# Patient Record
Sex: Female | Born: 1935 | Race: White | Hispanic: No | Smoking: Never smoker
Health system: Southern US, Community
[De-identification: ages and names within clinical notes are randomized; demographics above are authoritative.]

## PROBLEM LIST (undated history)

## (undated) DIAGNOSIS — M1990 Unspecified osteoarthritis, unspecified site: Secondary | ICD-10-CM

## (undated) DIAGNOSIS — Z87442 Personal history of urinary calculi: Secondary | ICD-10-CM

## (undated) DIAGNOSIS — E039 Hypothyroidism, unspecified: Secondary | ICD-10-CM

## (undated) DIAGNOSIS — H409 Unspecified glaucoma: Secondary | ICD-10-CM

## (undated) DIAGNOSIS — K219 Gastro-esophageal reflux disease without esophagitis: Secondary | ICD-10-CM

## (undated) DIAGNOSIS — I2510 Atherosclerotic heart disease of native coronary artery without angina pectoris: Secondary | ICD-10-CM

## (undated) DIAGNOSIS — I10 Essential (primary) hypertension: Secondary | ICD-10-CM

## (undated) DIAGNOSIS — F419 Anxiety disorder, unspecified: Secondary | ICD-10-CM

## (undated) DIAGNOSIS — I214 Non-ST elevation (NSTEMI) myocardial infarction: Secondary | ICD-10-CM

## (undated) DIAGNOSIS — E079 Disorder of thyroid, unspecified: Secondary | ICD-10-CM

## (undated) HISTORY — PX: KIDNEY SURGERY: SHX687

## (undated) HISTORY — PX: ABDOMINAL HYSTERECTOMY: SHX81

## (undated) HISTORY — PX: TONSILLECTOMY: SUR1361

## (undated) HISTORY — DX: Atherosclerotic heart disease of native coronary artery without angina pectoris: I25.10

---

## 2004-11-30 ENCOUNTER — Ambulatory Visit: Payer: Self-pay | Admitting: Internal Medicine

## 2005-11-26 ENCOUNTER — Ambulatory Visit: Payer: Self-pay | Admitting: Pain Medicine

## 2005-12-03 ENCOUNTER — Ambulatory Visit: Payer: Self-pay | Admitting: Pain Medicine

## 2005-12-04 ENCOUNTER — Ambulatory Visit: Payer: Self-pay | Admitting: Pain Medicine

## 2005-12-24 ENCOUNTER — Ambulatory Visit: Payer: Self-pay | Admitting: Internal Medicine

## 2006-01-08 ENCOUNTER — Ambulatory Visit: Payer: Self-pay | Admitting: Pain Medicine

## 2006-03-12 ENCOUNTER — Ambulatory Visit: Payer: Self-pay | Admitting: Internal Medicine

## 2006-10-23 ENCOUNTER — Ambulatory Visit: Payer: Self-pay | Admitting: Internal Medicine

## 2006-12-30 ENCOUNTER — Ambulatory Visit: Payer: Self-pay | Admitting: Internal Medicine

## 2007-10-27 IMAGING — US US PELV - US TRANSVAGINAL
1 series · 18 of 19 positions shown · non-contrast
Comparison: none

REASON FOR EXAM: Abdomen and pelvic pain
COMMENTS:

PROCEDURE:     US  - US PELVIS MASS EXAM  - [DATE] [DATE] [DATE] [DATE]
RESULT:       The patient has had a prior hysterectomy. The ovaries are not
visualized.  No pelvic fluid collections are noted.

[Series 1: us pelv - us transvaginal · 18 of 19 slices shown]
[im 1/19]
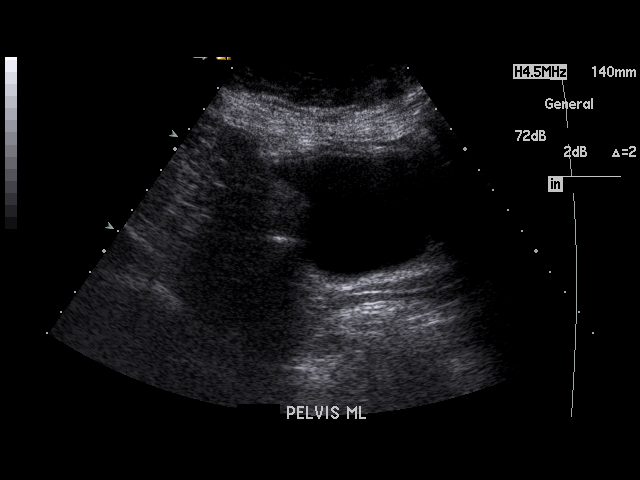
[im 2/19]
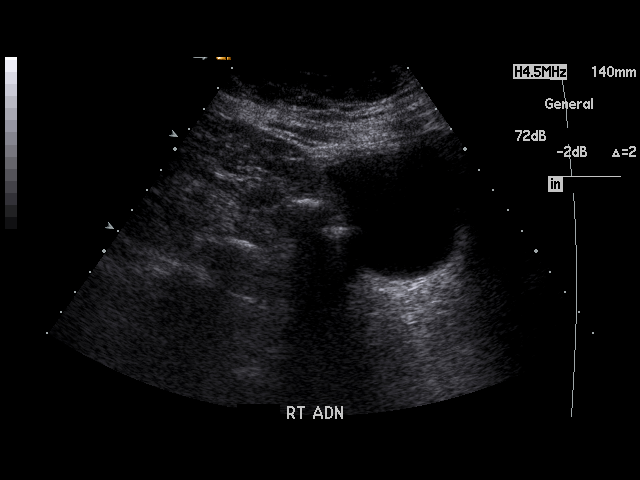
[im 3/19]
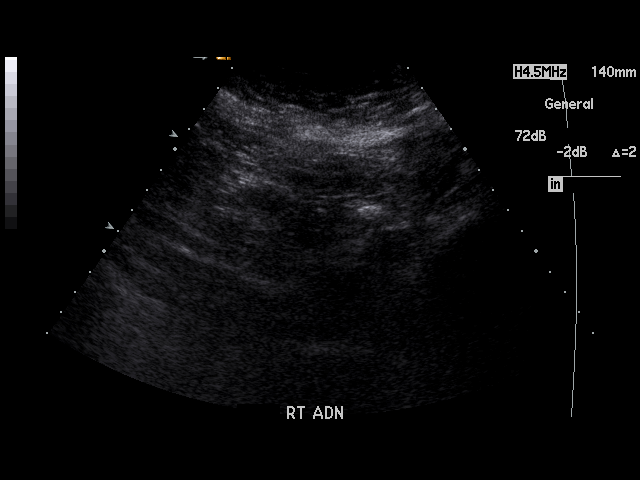
[im 4/19]
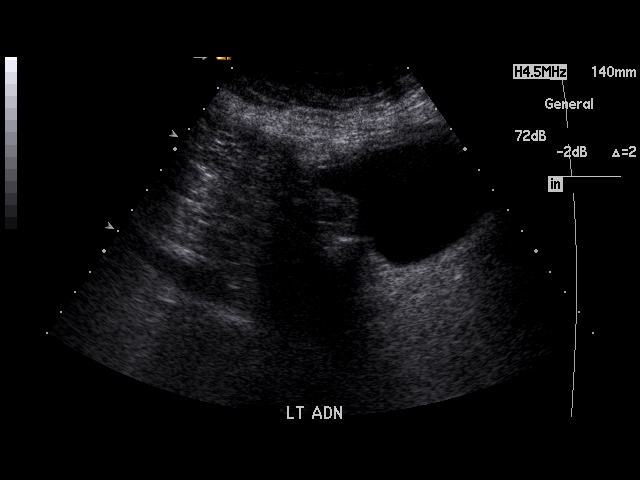
[im 5/19]
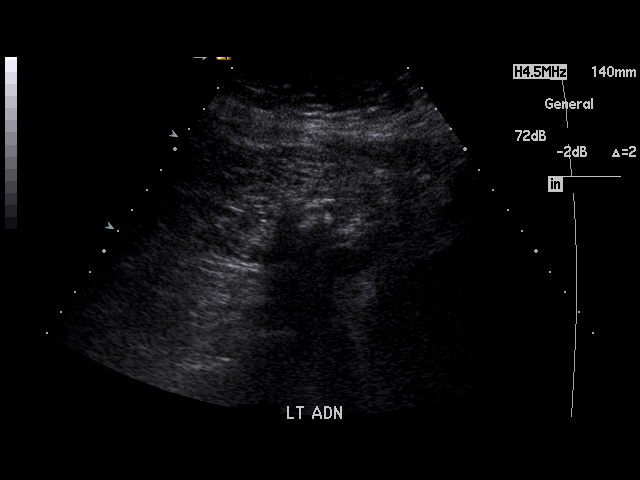
[im 6/19]
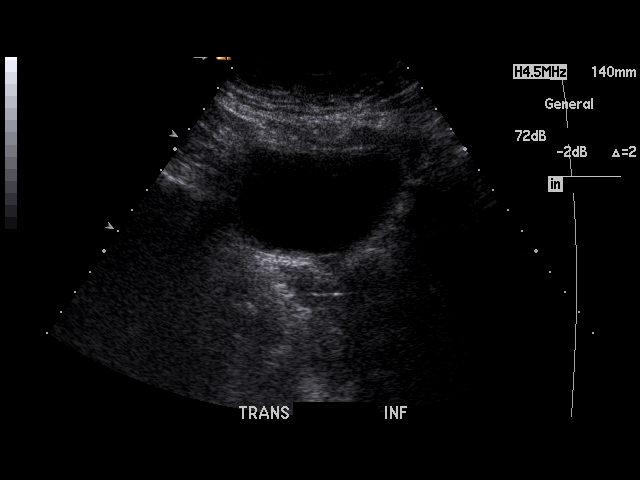
[im 7/19]
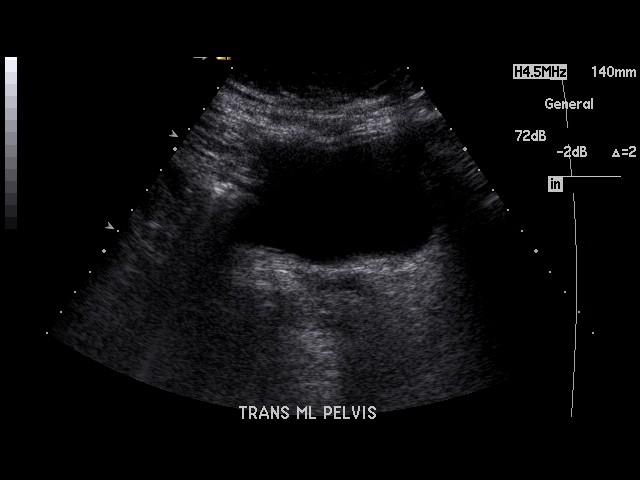
[im 8/19]
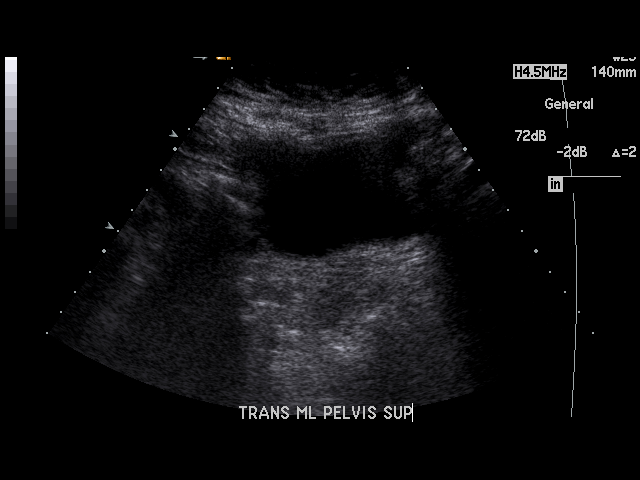
[im 9/19]
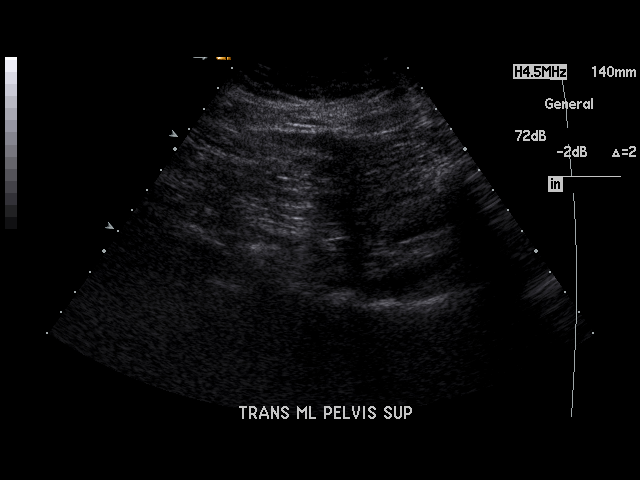
[im 11/19]
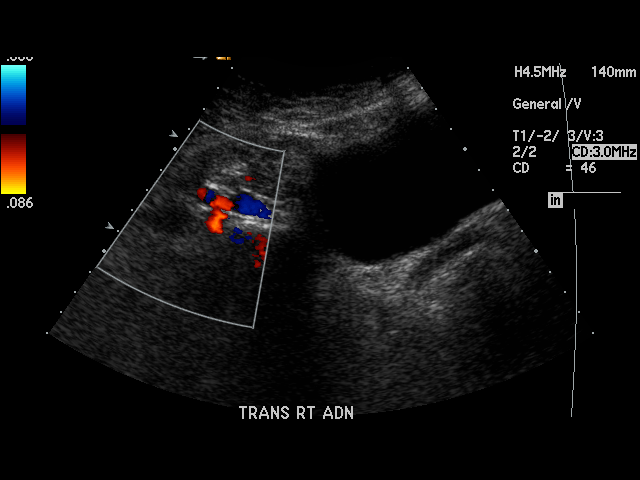
[im 12/19]
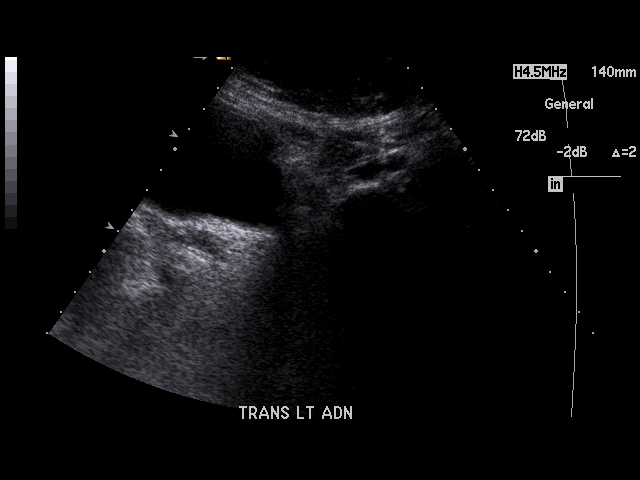
[im 13/19]
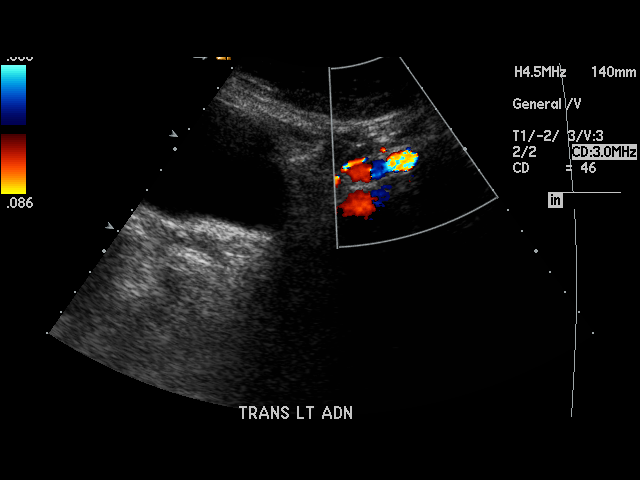
[im 14/19]
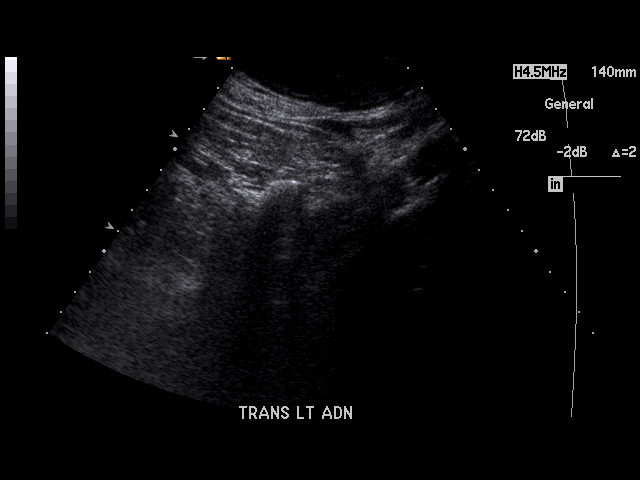
[im 15/19]
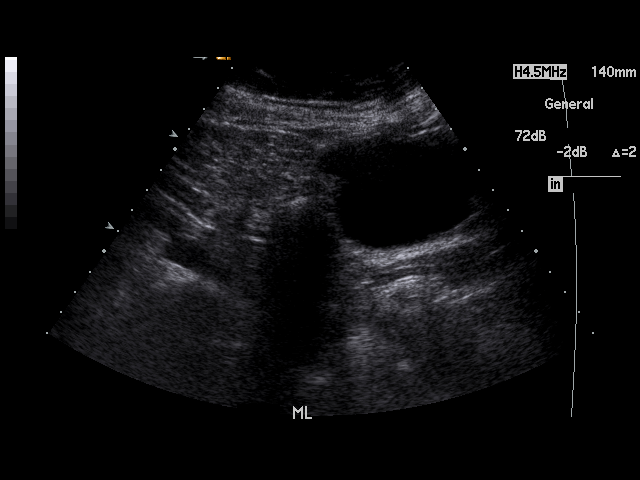
[im 16/19]
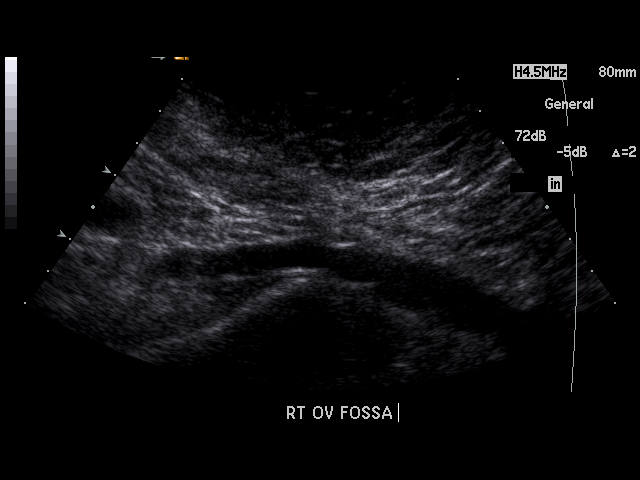
[im 17/19]
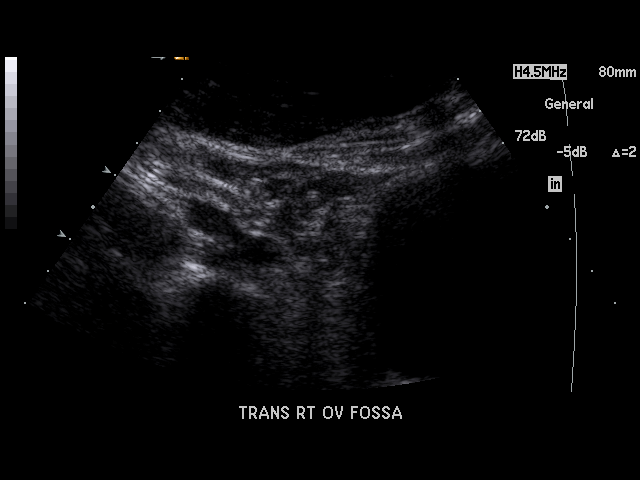
[im 18/19]
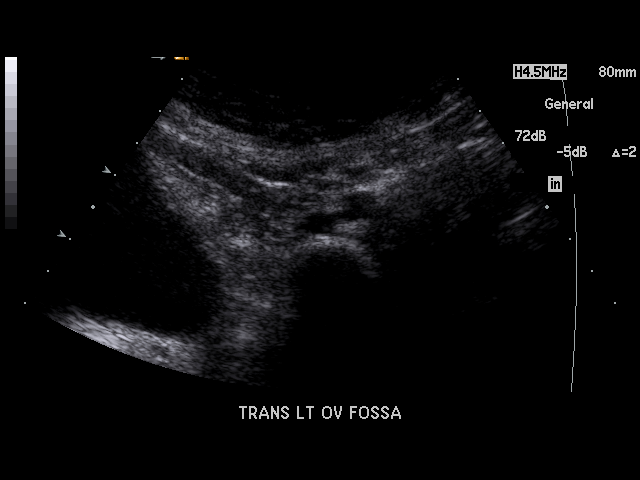
[im 19/19]
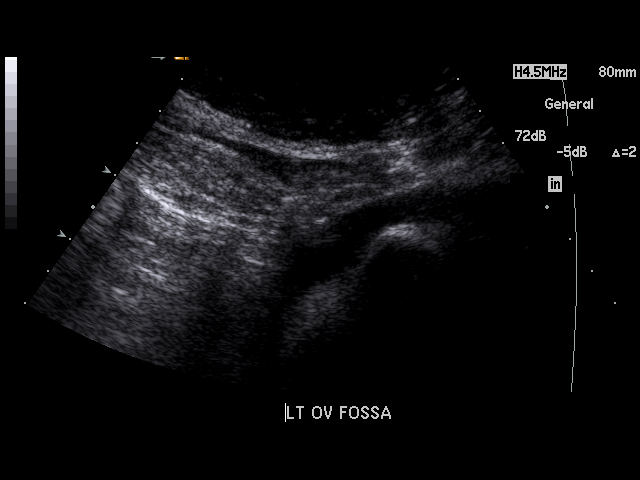

[18 of 19 positions shown; findings below may reference images not displayed]

IMPRESSION: Nonspecific pelvic ultrasound.

## 2007-12-28 ENCOUNTER — Ambulatory Visit: Payer: Self-pay | Admitting: Internal Medicine

## 2008-03-18 ENCOUNTER — Emergency Department: Payer: Self-pay | Admitting: Emergency Medicine

## 2008-03-28 ENCOUNTER — Ambulatory Visit: Payer: Self-pay | Admitting: Internal Medicine

## 2008-04-11 ENCOUNTER — Ambulatory Visit: Payer: Self-pay | Admitting: Internal Medicine

## 2008-12-29 ENCOUNTER — Ambulatory Visit: Payer: Self-pay | Admitting: Internal Medicine

## 2009-11-12 IMAGING — CT CT CHEST W/ CM
1 series · 15 of 32 positions shown, 19 images · IV contrast (APPLIED)
Comparison: none

REASON FOR EXAM: CP   SOB
COMMENTS:

[Series 4: soft tissue · axial · 0.66mm/px · z∈[-116,+150]mm · 15 of 100 slices shown, 19 images]
[im 7/100  soft-tissue]
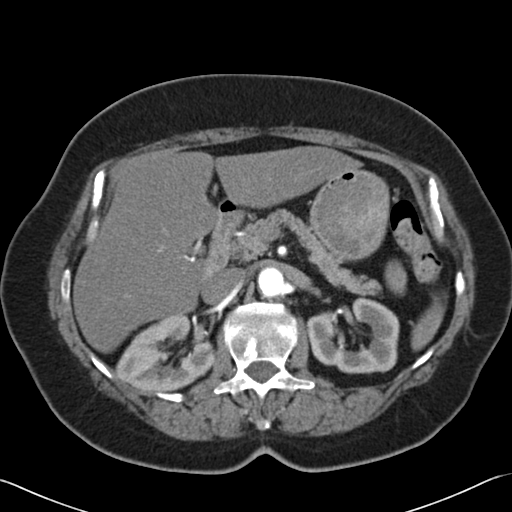
[im 7/100  bone]
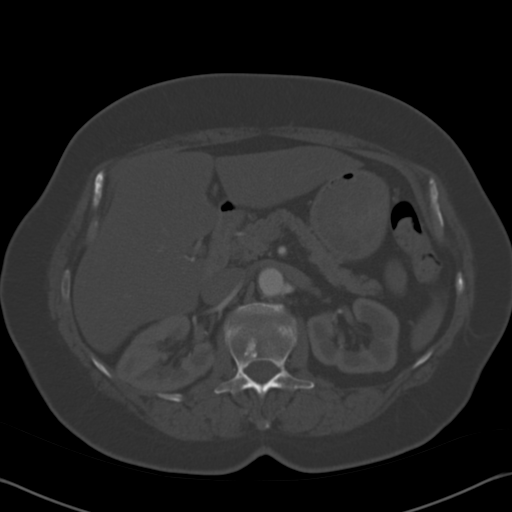
[im 13/100  soft-tissue]
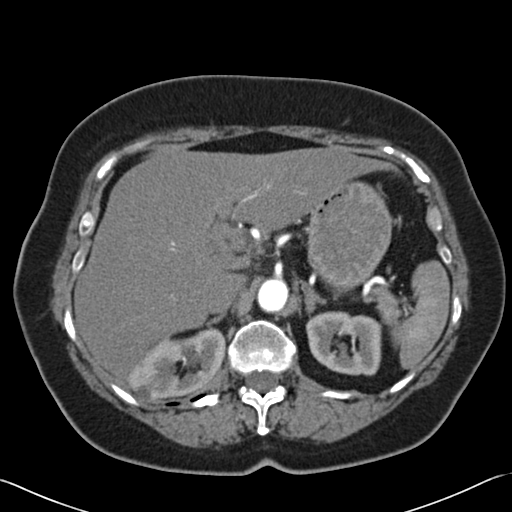
[im 20/100  soft-tissue]
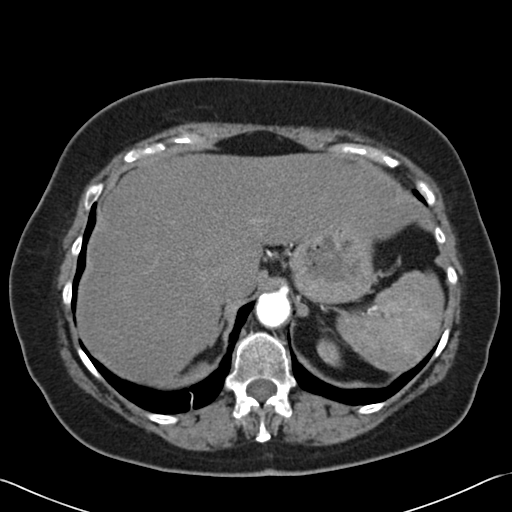
[im 29/100  soft-tissue]
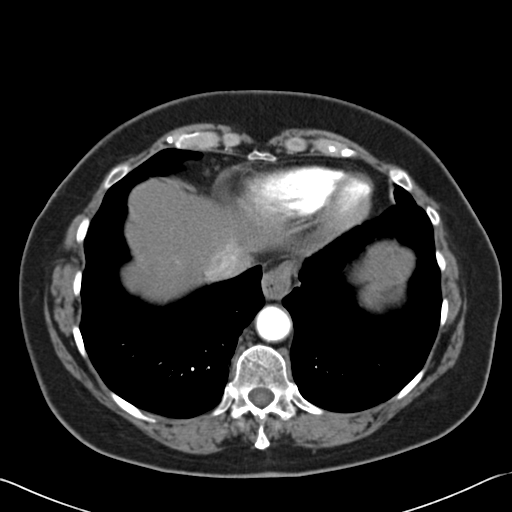
[im 36/100  soft-tissue]
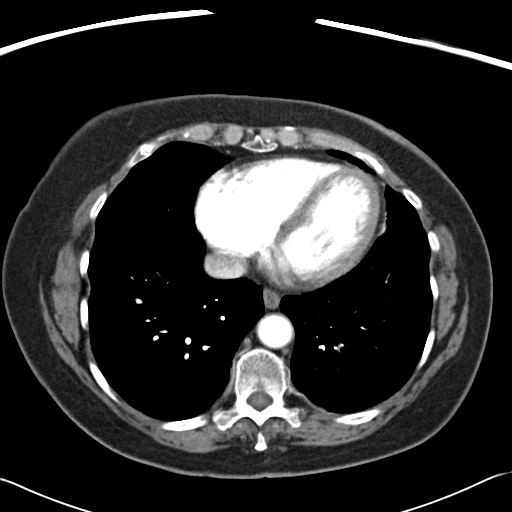
[im 42/100  soft-tissue]
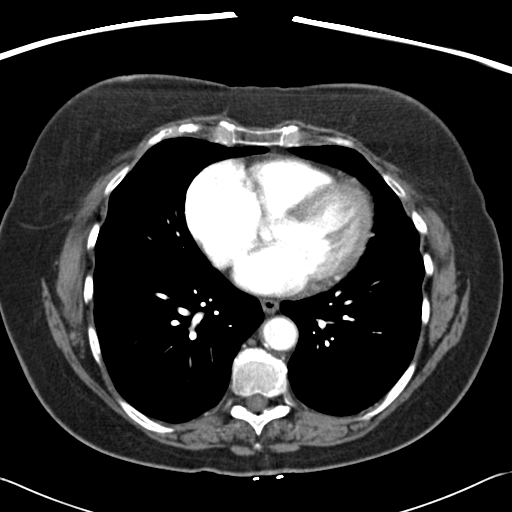
[im 52/100  soft-tissue]
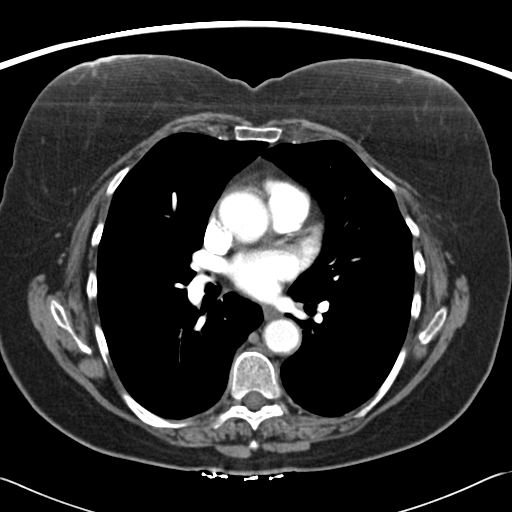
[im 58/100  soft-tissue]
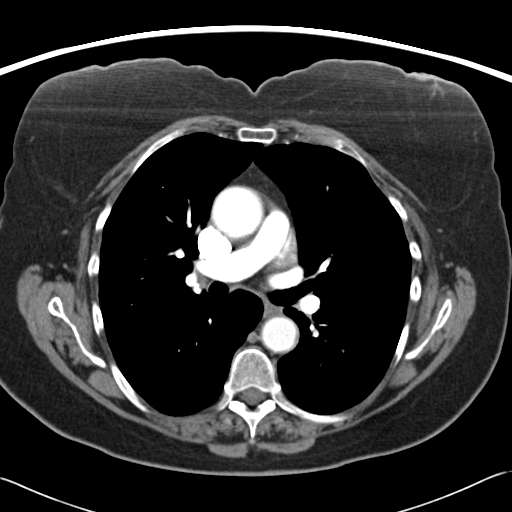
[im 64/100  soft-tissue]
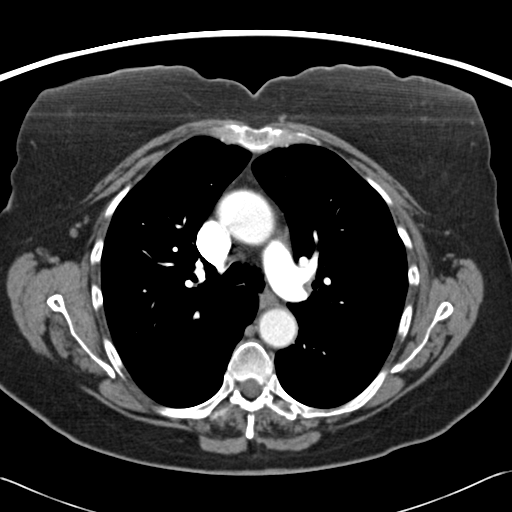
[im 64/100  bone]
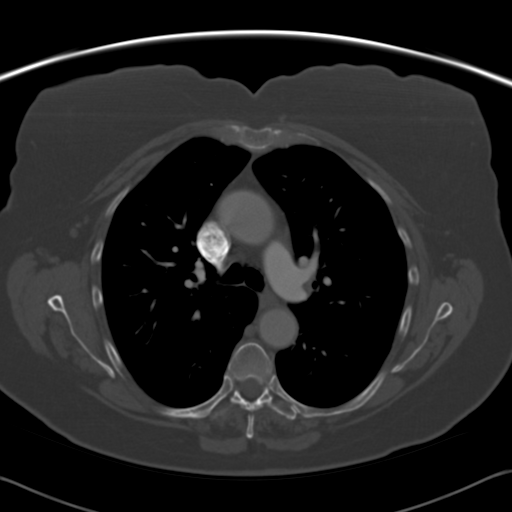
[im 71/100  soft-tissue]
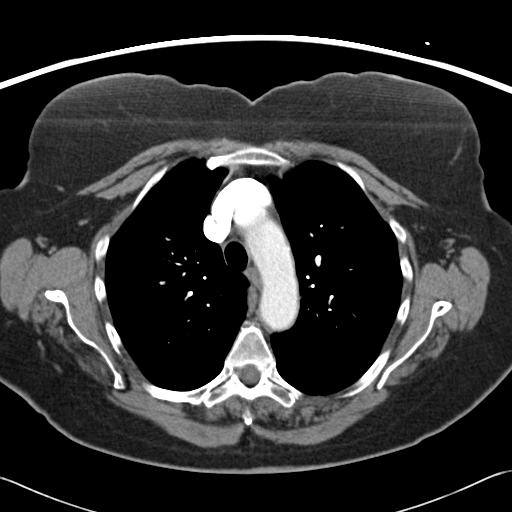
[im 80/100  soft-tissue]
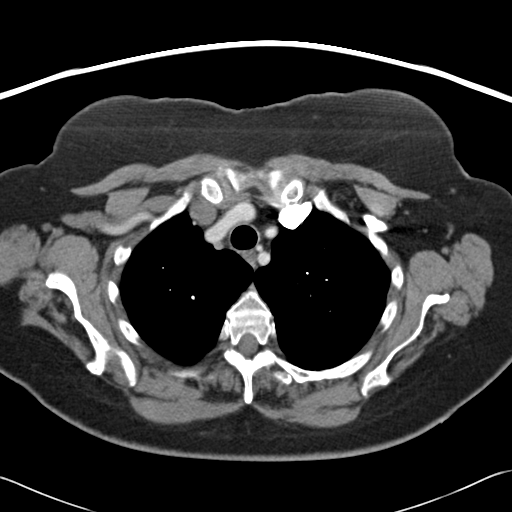
[im 87/100  soft-tissue]
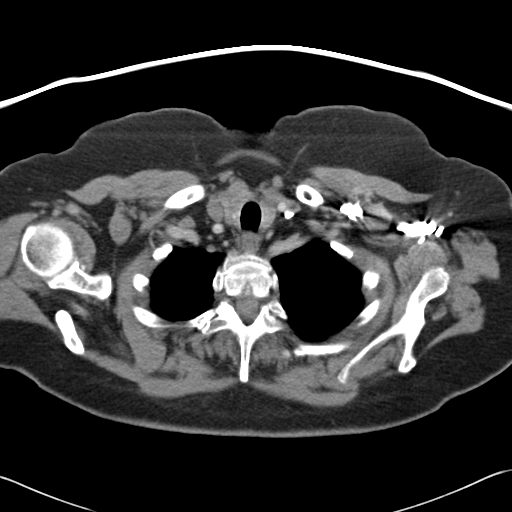
[im 87/100  lung]
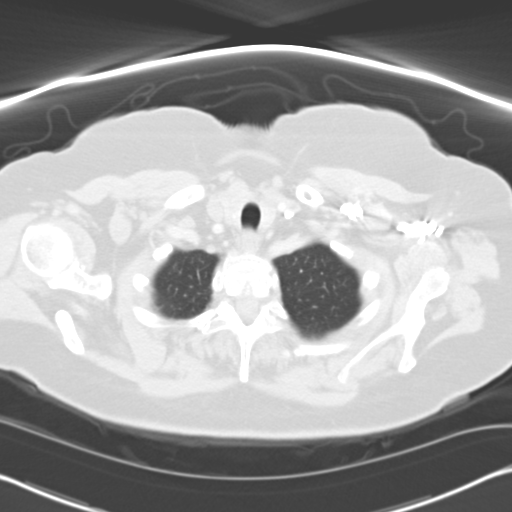
[im 90/100  lung]
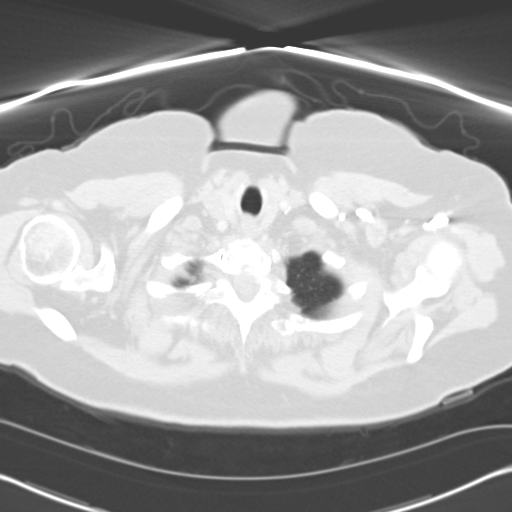
[im 93/100  soft-tissue]
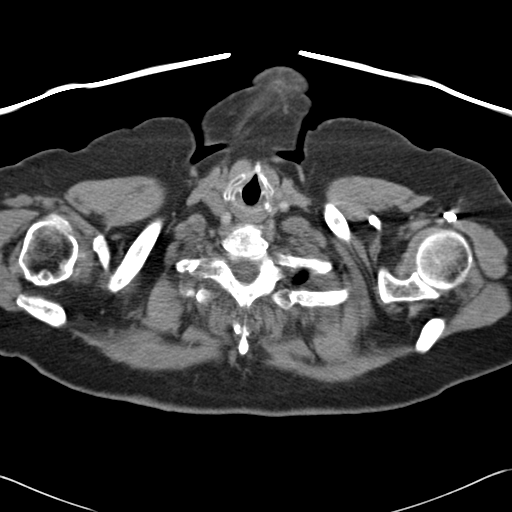
[im 93/100  lung]
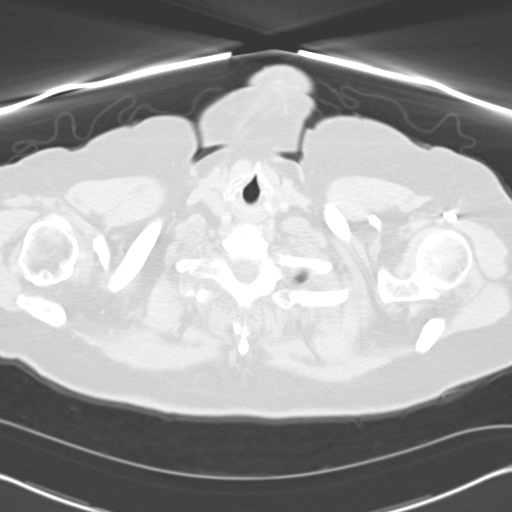
[im 96/100  lung]
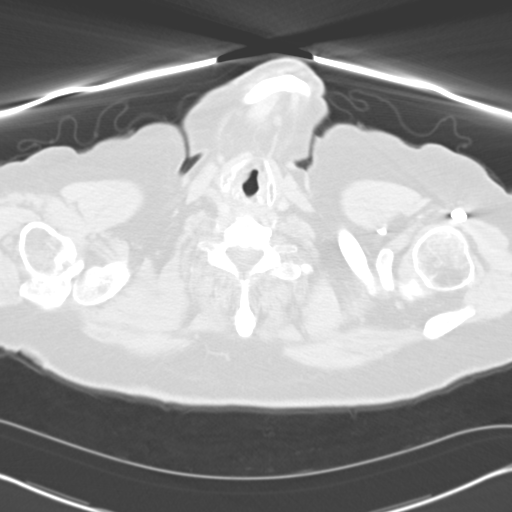

[15 of 32 positions shown; findings below may reference images not displayed]

PROCEDURE:     CT  - CT CHEST WITH CONTRAST  - March 28, 2008  [DATE]

RESULT:     Helical, 3 mm sections were obtained from the thoracic inlet
through the lung bases status post intravenous administration of 75 ml of
Osovue-THN.

Evaluation of the mediastinum and hilar regions and structures demonstrates
no evidence of mediastinal or hilar adenopathy or masses. There is no
evidence of filling defects within the main lobar or segmental pulmonary
arteries. Evaluation of the lung parenchyma demonstrates a linear area of
increased density along the medial basal segment of the right lower lobe.
This has a linear appearance and has a small soft tissue component.
Differential considerations are scar versus atelectasis versus pleural tag.
This can be monitored, if and as clinically warranted, though more ominous
etiologies are of much lower differential consideration. The visualized
upper abdominal viscera demonstrate a low attenuating nodule along the
posterior upper pole region of the right kidney likely representing a cyst.
The remainder of the visualized upper abdominal viscera is otherwise
unremarkable.
IMPRESSION: 1.     Likely scar versus atelectasis within the right lung base.
2.     No further focal or acute intrathoracic abnormalities.
3.     Likely small cyst within the posterior dome of the right kidney.

## 2010-01-11 ENCOUNTER — Ambulatory Visit: Payer: Self-pay | Admitting: Internal Medicine

## 2010-08-29 ENCOUNTER — Ambulatory Visit: Payer: Self-pay | Admitting: Unknown Physician Specialty

## 2011-02-25 ENCOUNTER — Ambulatory Visit: Payer: Self-pay | Admitting: Internal Medicine

## 2011-03-27 ENCOUNTER — Ambulatory Visit: Payer: Self-pay | Admitting: Internal Medicine

## 2012-02-26 ENCOUNTER — Ambulatory Visit: Payer: Self-pay | Admitting: Internal Medicine

## 2013-02-26 ENCOUNTER — Ambulatory Visit: Payer: Self-pay | Admitting: Internal Medicine

## 2013-09-10 ENCOUNTER — Observation Stay: Payer: Self-pay | Admitting: Internal Medicine

## 2013-09-10 LAB — CBC
HCT: 38.2 % (ref 35.0–47.0)
HGB: 12.4 g/dL (ref 12.0–16.0)
MCH: 30.3 pg (ref 26.0–34.0)
MCHC: 32.4 g/dL (ref 32.0–36.0)
MCV: 94 fL (ref 80–100)
PLATELETS: 260 10*3/uL (ref 150–440)
RBC: 4.08 10*6/uL (ref 3.80–5.20)
RDW: 13.7 % (ref 11.5–14.5)
WBC: 8.9 10*3/uL (ref 3.6–11.0)

## 2013-09-10 LAB — BASIC METABOLIC PANEL
Anion Gap: 7 (ref 7–16)
BUN: 15 mg/dL (ref 7–18)
CALCIUM: 9.8 mg/dL (ref 8.5–10.1)
CHLORIDE: 107 mmol/L (ref 98–107)
CO2: 26 mmol/L (ref 21–32)
CREATININE: 0.96 mg/dL (ref 0.60–1.30)
EGFR (African American): >60
GFR CALC NON AF AMER: 55 — AB
GLUCOSE: 96 mg/dL (ref 65–99)
Osmolality: 280 (ref 275–301)
POTASSIUM: 4 mmol/L (ref 3.5–5.1)
Sodium: 140 mmol/L (ref 136–145)

## 2013-09-10 LAB — CK TOTAL AND CKMB (NOT AT ARMC)
CK, Total: 74 U/L
CK-MB: 2 ng/mL (ref 0.5–3.6)

## 2013-09-10 LAB — TROPONIN I: Troponin-I: <0.02 ng/mL

## 2013-09-11 LAB — BASIC METABOLIC PANEL
ANION GAP: 8 (ref 7–16)
BUN: 12 mg/dL (ref 7–18)
Calcium, Total: 9.2 mg/dL (ref 8.5–10.1)
Chloride: 102 mmol/L (ref 98–107)
Co2: 25 mmol/L (ref 21–32)
Creatinine: 0.85 mg/dL (ref 0.60–1.30)
EGFR (African American): >60
EGFR (Non-African Amer.): >60
Glucose: 119 mg/dL — ABNORMAL HIGH (ref 65–99)
OSMOLALITY: 271 (ref 275–301)
Potassium: 3.8 mmol/L (ref 3.5–5.1)
Sodium: 135 mmol/L — ABNORMAL LOW (ref 136–145)

## 2013-09-11 LAB — LIPID PANEL
CHOLESTEROL: 191 mg/dL (ref 0–200)
HDL: 40 mg/dL (ref 40–60)
Ldl Cholesterol, Calc: 125 mg/dL — ABNORMAL HIGH (ref 0–100)
TRIGLYCERIDES: 129 mg/dL (ref 0–200)
VLDL CHOLESTEROL, CALC: 26 mg/dL (ref 5–40)

## 2013-09-11 LAB — TROPONIN I: Troponin-I: <0.02 ng/mL

## 2013-09-11 LAB — CK-MB
CK-MB: 2.1 ng/mL (ref 0.5–3.6)
CK-MB: 2.9 ng/mL (ref 0.5–3.6)

## 2013-10-27 ENCOUNTER — Ambulatory Visit: Payer: Self-pay | Admitting: Cardiovascular Disease

## 2014-06-04 NOTE — H&P (Signed)
PATIENT NAME:  Stephanie Key, Stephanie Key MR#:  [id number] DATE OF BIRTH:  Nov 12, 1928  DATE OF ADMISSION:  09/10/2013  PRIMARY CARE PHYSICIAN: Duane Lope. Judithann Sheen, MD.  CHIEF COMPLAINT: Chest pain.   HISTORY OF PRESENT ILLNESS:  A very pleasant 81 year old female with history of hypertension, hyperlipidemia, GERD, who presents with the above complaint. The patient says that this afternoon, she was just kind of sitting around when she developed sudden onset of chest pain, rate of about a 6/10.  It was radiating to her shoulder and her back. No other symptoms associated with it.  She actually took 2 of her husband's nitroglycerin and it relieved the pain. She is here in the ER for further evaluation where she is totally chest pain-free.  REVIEW OF SYSTEMS: CONSTITUTIONAL:  No fever, fatigue, weakness. EYES:  No blurred, double vision or glaucoma. ENT:  No ear pain, hearing loss, drainage, allergies. RESPIRATORY:  No cough, wheezing, hemoptysis, COPD. CARDIOVASCULAR:  Positive chest pain.  No orthopnea, palpitations, syncope, edema. GASTROINTESTINAL:  No nausea, vomiting, diarrhea, abdominal pain, melena or ulcers. GENITOURINARY:  No dysuria, hematuria. ENDOCRINE:  No polyuria or polydipsia.   HEMATOLOGIC:   No anemia, easy bruising or bleeding. SKIN:  No rashes or lesions. MUSCULOSKELETAL:  No weakness   NEUROLOGIC:  No history of CVA, TIA or seizures. PSYCHIATRIC:  No history of anxiety or depression.  PAST MEDICAL HISTORY:   Hypertension.  MEDICATIONS:   1.  Toprol 25 mg 1/2 tablet b.i.d.  2.  Synthroid 25 mcg daily.  3.  Prilosec 20 mg daily.  4.  Magnesium chloride daily.  5.  Aspirin 81 mg daily.   ALLERGIES: DARVON CAUSES RASH.   SOCIAL HISTORY: No tobacco, alcohol or IV drug use.   PAST SURGICAL HISTORY: 1.  Hysterectomy. 2.  Right kidney stone. 3.  Tonsillectomy. 4.  Appendectomy.   FAMILY HISTORY: Positive for CAD.   PHYSICAL EXAMINATION: VITAL SIGNS: Temperature 98.3, pulse  is 73, respirations 17, blood pressure 189/83, 98% on room air.  GENERAL: The patient is alert, oriented, not in acute distress. HEENT:  Head is atraumatic. Pupils are round.  Sclerae are anicteric. Mucous membranes are moist.  Oropharynx is clear.  NECK: Supple. No JVD, carotid bruit, or enlarged thyroid.  CARDIOVASCULAR: Regular rate and rhythm. No murmurs, gallops, or rubs. PMI is not displaced. LUNGS:  Clear to auscultation without crackles, rales, rhonchi or wheezing. Normal to percussion.  ABDOMEN: Bowel sounds are positive. Nontender, nondistended. No hepatosplenomegaly.  No rebound, guarding.  EXTREMITIES:  No clubbing, cyanosis or edema.  NEUROLOGIC:  Cranial nerves II-XII are intact.  No focal deficits. SKIN:  Without rash or lesions.   LABORATORY DATA: Troponin less than 0.2, white blood cell 8.9, hemoglobin 12.4, hematocrit 39, platelets are 260,000. Sodium 140, potassium 4.0, chloride 107, bicarbonate 26, BUN 15, creatinine 0.96, glucose is 96.   EKG is normal sinus rhythm. No ST elevation or depression.   ASSESSMENT AND PLAN: This is a very pleasant 81 year old female with a history of hypertension, hyperlipidemia, who presents with chest pain, relieved with 2 nitroglycerin.  1.  Unstable angina. The patient had new-onset chest pain, relieved with nitroglycerin, concerning for unstable angina. First set of troponins are negative. Telemetry so far is negative. EKG is negative. We will continue to cycle her cardiac enzymes.  I have written for full dose Lovenox. Continue aspirin, statin, beta blocker and she will need to undergo a Myoview stress test in the a.m. provided that her 3 troponins are  negative. If these are abnormal, she would like a consultation with Dr. Mariah MillingGollan, [facility]. Continue close monitoring.  2.  Malignant hypertension. The patient was restarted on metoprolol.  Will provide nitroglycerin topically as well as hydralazine p.r.n. Monitor blood pressure  closely. 3.   Hypothyroidism. Continue Synthroid.  4.  Hyperlipidemia. Continue statin medication and we will go ahead and order a lipid panel for the a.m.   CODE STATUS:  The patient is a DNR status.   TIME SPENT: Approximately 45 minutes.    ____________________________ Janyth ContesSital P. Juliene PinaMody, MD spm:ds D: 09/10/2013 18:34:23 ET T: 09/10/2013 18:58:34 ET JOB#: [id number]  cc: Symphoni Helbling P. Juliene PinaMody, MD, <Dictator> Duane LopeJeffrey D. Judithann SheenSparks, MD Janyth ContesSITAL P Rosario Duey MD ELECTRONICALLY SIGNED 09/10/2013 21:58

## 2014-06-04 NOTE — Consult Note (Signed)
PATIENT NAME:  Stephanie Key, Stephanie Key MR#:  [id number] DATE OF BIRTH:  11-30-1928  DATE OF CONSULTATION:  09/11/2013  REFERRING PHYSICIAN:  Dr. Judithann SheenSparks. CONSULTING PHYSICIAN:  Lamar BlinksBruce J. Gilberto Stanforth, MD  REASON FOR CONSULTATION: Chest pain.   CHIEF COMPLAINT: "I had chest pain."   HISTORY OF PRESENT ILLNESS: An 81 year old female with hypertension and no other cardiovascular disease in the past, who has had no evidence of significant symptoms with ambulation and walking in the last several weeks. She does have some mild dizziness on occasion, but has not been an issue of syncope. The patient has had a substernal chest discomfort radiating into her left breast, non-radiating thereafter, at a 6/10 intensity for which was completely relieved when seen in the Emergency Room. She has a normal EKG, and normal troponin with complete relief of symptoms and no return. She has been ambulating relatively well without further significant symptoms. She now feels well, breathing well. Chest x-ray was normal.    REVIEW OF SYSTEMS: Negative for vision change, ringing in the ears, hearing loss, cough, congestion, heartburn, nausea, vomiting, diarrhea, bloody stools, stomach pain, extremity pain, leg weakness, cramping of the buttocks, known blood clots, headaches, blackouts, nosebleeds, congestion, trouble swallowing, frequent urination, urination at night, muscle weakness, numbness, anxiety, depression, skin lesions or skin rashes.   PAST MEDICAL HISTORY: Hypertension.   FAMILY HISTORY: No family members with early onset of cardiovascular disease or hypertension.   SOCIAL HISTORY: Currently denies alcohol or tobacco use.   ALLERGIES: AS LISTED.   MEDICATIONS: As listed.   PHYSICAL EXAMINATION:  VITAL SIGNS: Blood pressure is 142/68 bilaterally, heart rate is 70 upright, reclining, and regular.  GENERAL: She is a well-appearing elderly female in no acute distress.  HEENT: No icterus, thyromegaly, ulcers, hemorrhage,  or xanthelasma.  CARDIOVASCULAR: Regular rate and rhythm. Normal S1 and S2 without murmur, gallop, or rub. PMI is normal size and placement. Carotid upstroke normal without bruit. Jugular venous pressure is normal.  LUNGS: Clear to auscultation with normal respirations.  ABDOMEN: Soft, nontender, without hepatosplenomegaly or masses. Abdominal aorta is normal size without bruit.  EXTREMITIES: Two plus radial, femoral, dorsal pedal pulses with no lower extremity edema,  cyanosis, clubbing, or ulcers.  NEUROLOGIC: She is oriented to time, place, and person with normal mood and affect.   ASSESSMENT: An 81 year old female with hypertension, episode of chest discomfort without evidence of EKG changes and/or myocardial infarction, now completely relieved and stable.   RECOMMENDATIONS:  1.  Ambulation following for any worsening recurrent chest pain.  2.  Continue hypertension medication management.  3.  Aspirin 81 mg each day. 4.  The patient okay for discharge to home with follow-up in 2 days for further outpatient evaluation and treatment options.   ____________________________ Lamar BlinksBruce J. Tyriek Hofman, MD bjk:ts D: 09/11/2013 08:29:54 ET T: 09/11/2013 10:59:47 ET JOB#: [id number]  cc: Lamar BlinksBruce J. Lorilyn Laitinen, MD, <Dictator> Lamar BlinksBRUCE J Tressie Ragin MD ELECTRONICALLY SIGNED 09/22/2013 10:16

## 2014-06-12 DIAGNOSIS — I214 Non-ST elevation (NSTEMI) myocardial infarction: Secondary | ICD-10-CM

## 2014-06-12 HISTORY — DX: Non-ST elevation (NSTEMI) myocardial infarction: I21.4

## 2014-07-08 ENCOUNTER — Other Ambulatory Visit: Payer: Self-pay

## 2014-07-08 ENCOUNTER — Inpatient Hospital Stay
Admission: EM | Admit: 2014-07-08 | Discharge: 2014-07-09 | DRG: 281 | Disposition: A | Discharge status: Other Acute Inpatient Hospital | Payer: Medicare Other | Attending: Internal Medicine | Admitting: Internal Medicine

## 2014-07-08 ENCOUNTER — Encounter: Payer: Self-pay | Admitting: Emergency Medicine

## 2014-07-08 ENCOUNTER — Emergency Department: Payer: Medicare Other

## 2014-07-08 DIAGNOSIS — R079 Chest pain, unspecified: Secondary | ICD-10-CM

## 2014-07-08 DIAGNOSIS — Z7982 Long term (current) use of aspirin: Secondary | ICD-10-CM

## 2014-07-08 DIAGNOSIS — I214 Non-ST elevation (NSTEMI) myocardial infarction: Principal | ICD-10-CM | POA: Diagnosis present

## 2014-07-08 DIAGNOSIS — I472 Ventricular tachycardia, unspecified: Secondary | ICD-10-CM

## 2014-07-08 DIAGNOSIS — I2510 Atherosclerotic heart disease of native coronary artery without angina pectoris: Secondary | ICD-10-CM | POA: Diagnosis not present

## 2014-07-08 DIAGNOSIS — M1990 Unspecified osteoarthritis, unspecified site: Secondary | ICD-10-CM | POA: Diagnosis present

## 2014-07-08 DIAGNOSIS — E871 Hypo-osmolality and hyponatremia: Secondary | ICD-10-CM | POA: Diagnosis present

## 2014-07-08 DIAGNOSIS — K219 Gastro-esophageal reflux disease without esophagitis: Secondary | ICD-10-CM | POA: Diagnosis present

## 2014-07-08 DIAGNOSIS — E039 Hypothyroidism, unspecified: Secondary | ICD-10-CM | POA: Diagnosis present

## 2014-07-08 DIAGNOSIS — Z8249 Family history of ischemic heart disease and other diseases of the circulatory system: Secondary | ICD-10-CM

## 2014-07-08 DIAGNOSIS — I10 Essential (primary) hypertension: Secondary | ICD-10-CM | POA: Diagnosis present

## 2014-07-08 HISTORY — DX: Hypothyroidism, unspecified: E03.9

## 2014-07-08 HISTORY — DX: Disorder of thyroid, unspecified: E07.9

## 2014-07-08 HISTORY — DX: Unspecified osteoarthritis, unspecified site: M19.90

## 2014-07-08 HISTORY — DX: Gastro-esophageal reflux disease without esophagitis: K21.9

## 2014-07-08 HISTORY — DX: Essential (primary) hypertension: I10

## 2014-07-08 HISTORY — DX: Anxiety disorder, unspecified: F41.9

## 2014-07-08 LAB — CBC WITH DIFFERENTIAL/PLATELET
Basophils Absolute: 0 10*3/uL (ref 0–0.1)
Basophils Relative: 0 %
Eosinophils Absolute: 0.1 10*3/uL (ref 0–0.7)
Eosinophils Relative: 1 %
HCT: 37.4 % (ref 35.0–47.0)
Hemoglobin: 12.5 g/dL (ref 12.0–16.0)
LYMPHS PCT: 14 %
Lymphs Abs: 1.6 10*3/uL (ref 1.0–3.6)
MCH: 30 pg (ref 26.0–34.0)
MCHC: 33.4 g/dL (ref 32.0–36.0)
MCV: 90 fL (ref 80.0–100.0)
MONO ABS: 0.8 10*3/uL (ref 0.2–0.9)
Monocytes Relative: 7 %
Neutro Abs: 8.7 10*3/uL — ABNORMAL HIGH (ref 1.4–6.5)
Neutrophils Relative %: 78 %
PLATELETS: 233 10*3/uL (ref 150–440)
RBC: 4.15 MIL/uL (ref 3.80–5.20)
RDW: 14.1 % (ref 11.5–14.5)
WBC: 11.2 10*3/uL — ABNORMAL HIGH (ref 3.6–11.0)

## 2014-07-08 LAB — TROPONIN I

## 2014-07-08 LAB — BASIC METABOLIC PANEL
Anion gap: 6 (ref 5–15)
BUN: 15 mg/dL (ref 6–20)
CO2: 25 mmol/L (ref 22–32)
CREATININE: 0.88 mg/dL (ref 0.44–1.00)
Calcium: 9.5 mg/dL (ref 8.9–10.3)
Chloride: 98 mmol/L — ABNORMAL LOW (ref 101–111)
GFR calc non Af Amer: 59 mL/min — ABNORMAL LOW (ref >60)
Glucose, Bld: 148 mg/dL — ABNORMAL HIGH (ref 65–99)
Potassium: 3.7 mmol/L (ref 3.5–5.1)
SODIUM: 129 mmol/L — AB (ref 135–145)

## 2014-07-08 MED ORDER — SODIUM CHLORIDE 0.9 % IV SOLN
INTRAVENOUS | Status: DC
  Administered 2014-07-08: 23:00:00 via INTRAVENOUS

## 2014-07-08 MED ORDER — DEXTROSE 5 % IV SOLN
60.0000 mg/h | Freq: Once | INTRAVENOUS | Status: AC
  Administered 2014-07-08: 60 mg/h via INTRAVENOUS

## 2014-07-08 MED ORDER — AMIODARONE HCL IN DEXTROSE 360-4.14 MG/200ML-% IV SOLN
30.0000 mg/h | INTRAVENOUS | Status: DC
  Administered 2014-07-09: 30 mg/h via INTRAVENOUS
  Filled 2014-07-08: qty 200

## 2014-07-08 MED ORDER — MAGNESIUM SULFATE 2 GM/50ML IV SOLN
2.0000 g | Freq: Once | INTRAVENOUS | Status: AC
  Administered 2014-07-08: 18:00:00 via INTRAVENOUS

## 2014-07-08 MED ORDER — HEPARIN SODIUM (PORCINE) 5000 UNIT/ML IJ SOLN
5000.0000 [IU] | Freq: Three times a day (TID) | INTRAMUSCULAR | Status: DC
  Administered 2014-07-08 – 2014-07-09 (×2): 5000 [IU] via SUBCUTANEOUS
  Filled 2014-07-08 (×2): qty 1

## 2014-07-08 MED ORDER — ASPIRIN 81 MG PO CHEW: 324.0000 mg | CHEWABLE_TABLET | Freq: Once | ORAL | Status: DC

## 2014-07-08 MED ORDER — POLYETHYLENE GLYCOL 3350 17 G PO PACK: 17.0000 g | PACK | Freq: Every day | ORAL | Status: DC | PRN

## 2014-07-08 MED ORDER — METOPROLOL TARTRATE 25 MG PO TABS
25.0000 mg | ORAL_TABLET | Freq: Two times a day (BID) | ORAL | Status: DC
  Administered 2014-07-09: 25 mg via ORAL
  Filled 2014-07-08: qty 1

## 2014-07-08 MED ORDER — MAGNESIUM SULFATE 2 GM/50ML IV SOLN
INTRAVENOUS | Status: AC
  Filled 2014-07-08: qty 50

## 2014-07-08 MED ORDER — ACETAMINOPHEN 325 MG PO TABS
650.0000 mg | ORAL_TABLET | Freq: Four times a day (QID) | ORAL | Status: DC | PRN
  Administered 2014-07-08: 650 mg via ORAL
  Filled 2014-07-08: qty 2

## 2014-07-08 MED ORDER — ACETAMINOPHEN 650 MG RE SUPP: 650.0000 mg | Freq: Four times a day (QID) | RECTAL | Status: DC | PRN

## 2014-07-08 MED ORDER — PANTOPRAZOLE SODIUM 40 MG PO TBEC
40.0000 mg | DELAYED_RELEASE_TABLET | Freq: Every day | ORAL | Status: DC
  Administered 2014-07-09: 40 mg via ORAL
  Filled 2014-07-08: qty 1

## 2014-07-08 MED ORDER — LEVOTHYROXINE SODIUM 50 MCG PO TABS
100.0000 ug | ORAL_TABLET | Freq: Every day | ORAL | Status: DC
  Administered 2014-07-09: 100 ug via ORAL
  Filled 2014-07-08: qty 2

## 2014-07-08 MED ORDER — METOPROLOL TARTRATE 25 MG PO TABS
12.5000 mg | ORAL_TABLET | Freq: Two times a day (BID) | ORAL | Status: DC
  Administered 2014-07-08: 12.5 mg via ORAL
  Filled 2014-07-08: qty 1

## 2014-07-08 MED ORDER — CYCLOBENZAPRINE HCL 10 MG PO TABS: 10.0000 mg | ORAL_TABLET | Freq: Every evening | ORAL | Status: DC | PRN

## 2014-07-08 MED ORDER — LABETALOL HCL 5 MG/ML IV SOLN
10.0000 mg | Freq: Four times a day (QID) | INTRAVENOUS | Status: DC | PRN
  Administered 2014-07-08: 10 mg via INTRAVENOUS
  Filled 2014-07-08: qty 4

## 2014-07-08 MED ORDER — AMIODARONE HCL IN DEXTROSE 360-4.14 MG/200ML-% IV SOLN
60.0000 mg/h | INTRAVENOUS | Status: DC
  Administered 2014-07-08: 29.88 mg/h via INTRAVENOUS
  Filled 2014-07-08: qty 200

## 2014-07-08 MED ORDER — AMIODARONE HCL IN DEXTROSE 360-4.14 MG/200ML-% IV SOLN
INTRAVENOUS | Status: AC
  Filled 2014-07-08: qty 200

## 2014-07-08 MED ORDER — ASPIRIN EC 325 MG PO TBEC
325.0000 mg | DELAYED_RELEASE_TABLET | Freq: Every day | ORAL | Status: DC
  Administered 2014-07-09: 325 mg via ORAL
  Filled 2014-07-08: qty 1

## 2014-07-08 NOTE — ED Provider Notes (Signed)
[facility] Emergency Department Provider Note  ____________________________________________  Time seen: 1725  I have reviewed the triage vital signs and the nursing notes.   HISTORY  Chief Complaint Chest Pain  chest pain    HPI Stephanie Key is a 81 y.o. female who reports that she began to feel funny at home this afternoon and had some chest pain. She was eating some yogurt and other foods when this began. She got up and moved to the living room to line her lazy boy chair. She started feel little bit better but still had chest pain. She denies nausea. She did not have any shortness of breath.  On arrival in the emergency department the patient apparently appeared well to the nursing staff. A history of GERD was noted. Then the patient started to feel "like she was going to die" while she displayed a long run of V. tach on the monitor that lasted approximately 8 seconds.  I was called to the room after the run of V. tach and stopped. We do have the episode recorded from the monitor and printed out. She reports this feeling was worse than the one she had at home. It's unclear if she had had a run of V. tach at home.   Past Medical History  Diagnosis Date  . Hypertension   . Thyroid disease     High cholesterol  There are no active problems to display for this patient.   No past surgical history on file.  Current Outpatient Rx  Name  Route  Sig  Dispense  Refill  . aspirin EC 325 MG tablet   Oral   Take 325 mg by mouth daily.         . Cholecalciferol (VITAMIN D-3 PO)   Oral   Take 1 tablet by mouth daily.         Marland Kitchen CINNAMON PO   Oral   Take 1 tablet by mouth daily.         . cyclobenzaprine (FLEXERIL) 10 MG tablet   Oral   Take 10 mg by mouth at bedtime as needed for muscle spasms.         Marland Kitchen FIBER PO   Oral   Take 1 tablet by mouth daily.         Marland Kitchen levofloxacin (LEVAQUIN) 500 MG tablet   Oral   Take 500 mg by mouth  daily.         Marland Kitchen levothyroxine (SYNTHROID, LEVOTHROID) 100 MCG tablet   Oral   Take 100 mcg by mouth daily before breakfast.         . magnesium oxide (MAG-OX) 400 MG tablet   Oral   Take 400 mg by mouth daily.         . metoprolol tartrate (LOPRESSOR) 25 MG tablet   Oral   Take 12.5 mg by mouth 2 (two) times daily.         Marland Kitchen omeprazole (PRILOSEC) 20 MG capsule   Oral   Take 20 mg by mouth 2 (two) times daily.           Allergies Ceclor; Darvon; Hydralazine; Losartan; and Sulfa antibiotics  No family history on file.  Social History History  Substance Use Topics  . Smoking status: Never Smoker   . Smokeless tobacco: Not on file  . Alcohol Use: No   negative for tobacco or alcohol use. No drug use.  Review of Systems  Constitutional: Negative for fever. ENT: Negative  for sore throat. Cardiovascular: Positive for chest pain. Respiratory: Negative for shortness of breath. Gastrointestinal: Negative for abdominal pain, vomiting and diarrhea. Genitourinary: Negative for dysuria. Musculoskeletal: Negative for back pain. Skin: Negative for rash. Neurological: Negative for headaches   10-point ROS otherwise negative.  ____________________________________________   PHYSICAL EXAM:  VITAL SIGNS: ED Triage Vitals  Enc Vitals Group     BP --      Pulse --      Resp --      Temp --      Temp Source 07/08/14 1708 Oral     SpO2 --      Weight --      Height --      Head Cir --      Peak Flow --      Pain Score --      Pain Loc --      Pain Edu? --      Excl. in GC? --     Constitutional:  Alert and oriented, pleasant and communicative. ENT   Head: Normocephalic and atraumatic.   Nose: No congestion/rhinnorhea.   Mouth/Throat: Mucous membranes are moist.      Ears: Cardiovascular: Normal rate, regular rhythm. No ectopy or V. tach during my exam. Respiratory: Normal respiratory effort without tachypnea. Breath sounds are clear and equal  bilaterally. No wheezes/rales/rhonchi. Gastrointestinal: Soft and nontender. No distention.  Back: No muscle spasm, no tenderness, no CVA tenderness. Musculoskeletal: Nontender with normal range of motion in all extremities.  No noted edema. Neurologic:  Normal speech and language. No gross focal neurologic deficits are appreciated.  Skin:  Skin is warm, dry. No rash noted. Psychiatric: Mood and affect are normal. Speech and behavior are normal.  ____________________________________________    LABS (pertinent positives/negatives)  Sodium of 129 potassium of 3.7 glucose 148 Troponin is less than 0.03  ____________________________________________   EKG  ED ECG REPORT I, Bedie Dominey W, the attending physician, personally viewed and interpreted this ECG.   Date: 07/08/2014  EKG Time: 1656  Rate: 81  Rhythm: Normal sinus rhythm  Axis: -18. Left axis.  Intervals: Normal  ST&T Change: None  Monitor interpretation at 1720 and 27 seconds: Patient was in sinus rhythm when she went into a 8 second run of V. tach with variable amplitude worrisome for torsades.   ED ECG REPORT I, Joyia Riehle W, the attending physician, personally viewed and interpreted this ECG.   Date: 07/08/2014  EKG Time: After her run of V. tach area and time of EKG at 1723  Rate: 92   Rhythm: Normal sinus rhythm  Axis: -18, left axis  Intervals: Within normal limits  ST&T Change: None  ____________________________________________    RADIOLOGY  Chest x-ray: IMPRESSION: Mild bibasilar scarring. Cardiomegaly noted  ____________________________________________   Critical Care performed:  CRITICAL CARE Performed by: Darien Ramus   Total critical care time: 35 min. due to the critical condition of this patient with her run of V. tach. We had given her an urgent magnesium infusion and started amiodarone drip. We discussed the case with Dr. Clent Ridges for admission to the hospital.  Critical  care time was exclusive of separately billable procedures and treating other patients.  Critical care was necessary to treat or prevent imminent or life-threatening deterioration.  Critical care was time spent personally by me on the following activities: development of treatment plan with patient and/or surrogate as well as nursing, discussions with consultants, evaluation of patient's response to treatment, examination of patient, obtaining history  from patient or surrogate, ordering and performing treatments and interventions, ordering and review of laboratory studies, ordering and review of radiographic studies, pulse oximetry and re-evaluation of patient's condition.   ____________________________________________   INITIAL IMPRESSION / ASSESSMENT AND PLAN / ED COURSE  Patient seen after her run of V. tach. She is pleasant and alert and in no acute distress. We will start magnesium IV as well as amiodarone IV and continue with her evaluation with the plan to have her admitted to the hospital due to this chest pain with V. Tach.  ----------------------------------------- 7:24 PM on 07/08/2014 -----------------------------------------  The patient has appeared stable since her notable run of V. Tach. A CBC had not been ordered but that will be changed now. We have discussed the case with Dr. Clent RidgesWalsh for admission to the hospital.  ____________________________________________   FINAL CLINICAL IMPRESSION(S) / ED DIAGNOSES  Final diagnoses:  Chest pain, unspecified chest pain type  Ventricular tachycardia seen on cardiac monitor      Darien Ramusavid W Mamye Bolds, MD 07/08/14 Kristopher [id number]

## 2014-07-08 NOTE — H&P (Signed)
[facility] - [place] at [facility]   PATIENT NAME: Stephanie Key    MR#:  [id number]  DATE OF BIRTH:  1928-09-19  DATE OF ADMISSION:  07/08/2014  PRIMARY CARE PHYSICIAN: No PCP Per Patient   REQUESTING/REFERRING PHYSICIAN: Dr. Carollee Massed  CHIEF COMPLAINT:   Chief Complaint  Patient presents with  . Chest Pain    HISTORY OF PRESENT ILLNESS:  Stephanie Key  is a 81 y.o. female with a known history of hypertension, hypothyroidism, noncardiac chest pain in the past resents with an episode of feeling "funny" across the chest. She reports that she was sitting at her dining table reading the newspaper when she developed a strange feeling across her chest. She denies overt chest pain diaphoresis nausea vomiting or presyncope. The sensation was enough to bring her to the emergency room. While being evaluated in the emergency room she went into ventricular tachycardia for at least 6 seconds on the rhythm trip provided. At the time of that episode she did become diaphoretic and weak and felt "like she was going to die". Rhythm corrected without intervention. She was then given magnesium and started on amiodarone drip.  PAST MEDICAL HISTORY:   Past Medical History  Diagnosis Date  . Hypertension   . Thyroid disease   . Hypothyroidism   . Anxiety   . Arthritis   . GERD (gastroesophageal reflux disease)     PAST SURGICAL HISTORY:   Past Surgical History  Procedure Laterality Date  . Abdominal hysterectomy      SOCIAL HISTORY:   History  Substance Use Topics  . Smoking status: Never Smoker   . Smokeless tobacco: Not on file  . Alcohol Use: No    FAMILY HISTORY:   Family History  Problem Relation Age of Onset  . CAD Father     DRUG ALLERGIES:   Allergies  Allergen Reactions  . Ceclor [Cefaclor] Other (See Comments)    Reaction:  Unknown   . Darvon [Propoxyphene] Other (See Comments)    Reaction:  Unknown   . Hydralazine Other (See Comments)     Reaction:  Abdominal pain   . Losartan Other (See Comments)    Reaction:  Increased BP   . Sulfa Antibiotics Rash    REVIEW OF SYSTEMS:   Review of Systems  Constitutional: Positive for diaphoresis. Negative for fever, chills and weight loss.  Eyes: Negative for blurred vision and pain.  Respiratory: Negative for cough, hemoptysis, sputum production, shortness of breath and wheezing.   Cardiovascular: Positive for palpitations. Negative for chest pain, orthopnea, claudication, leg swelling and PND.  Gastrointestinal: Negative for heartburn, nausea, vomiting, abdominal pain, diarrhea and constipation.  Genitourinary: Negative for dysuria and frequency.  Neurological: Negative for dizziness, focal weakness, seizures and headaches.  Endo/Heme/Allergies: Does not bruise/bleed easily.  Psychiatric/Behavioral: Negative for depression. The patient is nervous/anxious.     MEDICATIONS AT HOME:   Prior to Admission medications   Medication Sig Start Date End Date Taking? Authorizing Provider  aspirin EC 325 MG tablet Take 325 mg by mouth daily.   Yes Historical Provider, MD  Cholecalciferol (VITAMIN D-3 PO) Take 1 tablet by mouth daily.   Yes Historical Provider, MD  CINNAMON PO Take 1 tablet by mouth daily.   Yes Historical Provider, MD  cyclobenzaprine (FLEXERIL) 10 MG tablet Take 10 mg by mouth at bedtime as needed for muscle spasms.   Yes Historical Provider, MD  FIBER PO Take 1 tablet by mouth daily.   Yes Historical  Provider, MD  levofloxacin (LEVAQUIN) 500 MG tablet Take 500 mg by mouth daily. 06/29/14 07/09/14 Yes Historical Provider, MD  levothyroxine (SYNTHROID, LEVOTHROID) 100 MCG tablet Take 100 mcg by mouth daily before breakfast.   Yes Historical Provider, MD  magnesium oxide (MAG-OX) 400 MG tablet Take 400 mg by mouth daily.   Yes Historical Provider, MD  metoprolol tartrate (LOPRESSOR) 25 MG tablet Take 12.5 mg by mouth 2 (two) times daily.   Yes Historical Provider, MD   omeprazole (PRILOSEC) 20 MG capsule Take 20 mg by mouth 2 (two) times daily.   Yes Historical Provider, MD      VITAL SIGNS:  Blood pressure 189/92, pulse 93, temperature 98.9 F (37.2 C), height 5\' 2"  (1.575 m), weight 79.379 kg (175 lb), SpO2 98 %.  PHYSICAL EXAMINATION:  GENERAL:  81 y.o.-year-old patient lying in the bed very anxious, pads in place EYES: Pupils equal, round, reactive to light and accommodation. No scleral icterus. Extraocular muscles intact.  HEENT: Head atraumatic, normocephalic. Oropharynx and nasopharynx clear. His membranes are moist, good dentition NECK:  Supple, no jugular venous distention. No thyroid enlargement, no tenderness.  LUNGS: Normal breath sounds bilaterally, no wheezing, rales,rhonchi or crepitation. No use of accessory muscles of respiration.  CARDIOVASCULAR: S1, S2 normal. No murmurs, rubs, or gallops.  ABDOMEN: Soft, nontender, nondistended. Bowel sounds present. No organomegaly or mass.  EXTREMITIES: +1 pedal edema, cyanosis, or clubbing.  NEUROLOGIC: Cranial nerves II through XII are intact. Muscle strength 5/5 in all extremities. Sensation intact. Gait not checked.  PSYCHIATRIC: The patient is alert and oriented x 3. Anxious SKIN: No obvious rash, lesion, or ulcer.   LABORATORY PANEL:   CBC No results for input(s): WBC, HGB, HCT, PLT in the last 168 hours. ------------------------------------------------------------------------------------------------------------------  Chemistries   Recent Labs Lab 07/08/14 1751  NA 129*  K 3.7  CL 98*  CO2 25  GLUCOSE 148*  BUN 15  CREATININE 0.88  CALCIUM 9.5   ------------------------------------------------------------------------------------------------------------------  Cardiac Enzymes  Recent Labs Lab 07/08/14 1751  TROPONINI <0.03   ------------------------------------------------------------------------------------------------------------------  RADIOLOGY:  Dg Chest Port 1  View  07/08/2014   CLINICAL DATA:  Acute onset of centralized chest pain. Initial encounter.  EXAM: PORTABLE CHEST - 1 VIEW  COMPARISON:  Chest radiograph performed 09/10/2013  FINDINGS: The lungs are well-aerated. Mild scarring is noted at the lung bases. Pulmonary vascularity is at the upper limits of normal. There is no evidence of pleural effusion or pneumothorax. An external pacing pad is noted on the left.  The cardiomediastinal silhouette is enlarged. No acute osseous abnormalities are seen.  IMPRESSION: Mild bibasilar scarring.  Cardiomegaly noted.   Electronically Signed   By: Roanna RaiderJeffery  Chang M.D.   On: 07/08/2014 19:18    EKG:   Orders placed or performed during the hospital encounter of 07/08/14  . ED EKG  . ED EKG    IMPRESSION AND PLAN:   Active Problems:   Ventricular tachycardia   Hyponatremia  Problem #1 chest pain with ventricular tachycardia during emergency room evaluation: Currently on amiodarone drip with no further episodes. External defibrillator pads in place. Electrolytes are normal with the exception of low sodium discussed below. We will cycle cardiac enzymes 3. Will monitor in the stepdown unit. 2-D echocardiogram ordered. Cardiology consult requested. Continue metoprolol as she is on at home, increased dose. Rhythm strip possibly suggestive of torsades the point and she has been given magnesium. Checking magnesium level. Check TSH  #2 hyponatremia: currently her only electrolyte imbalances  hyponatremia. Treating with normal saline.  #3 hypertension: Blood pressure elevated currently. Continue metoprolol with increased dose, continue amiodarone drip.  #4 gastroesophageal reflux disease: Continue PPI  #5 hypothyroidism: Continue Synthroid and check TSH   All the records are reviewed and case discussed with ED provider. Management plans discussed with the patient, family and they are in agreement.  CODE STATUS: Full. She had been a DO NOT RESUSCITATE in the  past but on discussion of her current condition today she would like to be a full code. She would like her daughter Marylu Lund to be her decision maker  TOTAL TIME TAKING CARE OF THIS PATIENT: 55 minutes.    Elby Showers M.D on 07/08/2014 at 8:10 PM  Between 7am to 6pm - Pager - [phone]  After 6pm go to [email or web address] - password [email or web address]  [facility]  Office  [phone]  CC: Primary care physician; No PCP Per Patient

## 2014-07-08 NOTE — ED Notes (Signed)
Brought to ed via ems for evaluation of centralized chest pain beginning this afternoon. Received A&O*3 speaking full sentences. Complaining of 8/10 centralized chest pain. Pt states "i took my husbands nitro" . Awaiting further evaluation.

## 2014-07-09 ENCOUNTER — Encounter (HOSPITAL_COMMUNITY)
Admission: AD | Disposition: A | Discharge status: Home Health | Payer: Medicare Other | Source: Other Acute Inpatient Hospital | Attending: Cardiology

## 2014-07-09 ENCOUNTER — Inpatient Hospital Stay
Admit: 2014-07-09 | Discharge: 2014-07-09 | Disposition: A | Discharge status: Home / Self Care | Payer: Medicare Other | Attending: Internal Medicine | Admitting: Internal Medicine

## 2014-07-09 ENCOUNTER — Inpatient Hospital Stay (HOSPITAL_COMMUNITY)
Admission: AD | Admit: 2014-07-09 | Discharge: 2014-07-12 | DRG: 281 | Disposition: A | Discharge status: Home Health | Payer: Medicare Other | Source: Other Acute Inpatient Hospital | Attending: Cardiology | Admitting: Cardiology

## 2014-07-09 DIAGNOSIS — Z888 Allergy status to other drugs, medicaments and biological substances status: Secondary | ICD-10-CM

## 2014-07-09 DIAGNOSIS — I10 Essential (primary) hypertension: Secondary | ICD-10-CM | POA: Diagnosis present

## 2014-07-09 DIAGNOSIS — Z881 Allergy status to other antibiotic agents status: Secondary | ICD-10-CM | POA: Diagnosis not present

## 2014-07-09 DIAGNOSIS — I472 Ventricular tachycardia, unspecified: Secondary | ICD-10-CM

## 2014-07-09 DIAGNOSIS — F419 Anxiety disorder, unspecified: Secondary | ICD-10-CM | POA: Diagnosis present

## 2014-07-09 DIAGNOSIS — I214 Non-ST elevation (NSTEMI) myocardial infarction: Secondary | ICD-10-CM | POA: Diagnosis present

## 2014-07-09 DIAGNOSIS — K219 Gastro-esophageal reflux disease without esophagitis: Secondary | ICD-10-CM | POA: Diagnosis present

## 2014-07-09 DIAGNOSIS — Z7982 Long term (current) use of aspirin: Secondary | ICD-10-CM | POA: Diagnosis not present

## 2014-07-09 DIAGNOSIS — Z79899 Other long term (current) drug therapy: Secondary | ICD-10-CM | POA: Diagnosis not present

## 2014-07-09 DIAGNOSIS — E785 Hyperlipidemia, unspecified: Secondary | ICD-10-CM | POA: Diagnosis not present

## 2014-07-09 DIAGNOSIS — I2510 Atherosclerotic heart disease of native coronary artery without angina pectoris: Secondary | ICD-10-CM | POA: Diagnosis not present

## 2014-07-09 DIAGNOSIS — I209 Angina pectoris, unspecified: Secondary | ICD-10-CM | POA: Diagnosis not present

## 2014-07-09 DIAGNOSIS — E039 Hypothyroidism, unspecified: Secondary | ICD-10-CM | POA: Diagnosis present

## 2014-07-09 DIAGNOSIS — Z8249 Family history of ischemic heart disease and other diseases of the circulatory system: Secondary | ICD-10-CM | POA: Diagnosis not present

## 2014-07-09 HISTORY — DX: Non-ST elevation (NSTEMI) myocardial infarction: I21.4

## 2014-07-09 HISTORY — PX: CARDIAC CATHETERIZATION: SHX172

## 2014-07-09 HISTORY — DX: Unspecified glaucoma: H40.9

## 2014-07-09 HISTORY — DX: Personal history of urinary calculi: Z87.442

## 2014-07-09 LAB — CBC
HCT: 35.3 % (ref 35.0–47.0)
Hemoglobin: 11.9 g/dL — ABNORMAL LOW (ref 12.0–16.0)
MCH: 30.4 pg (ref 26.0–34.0)
MCHC: 33.5 g/dL (ref 32.0–36.0)
MCV: 90.7 fL (ref 80.0–100.0)
Platelets: 211 10*3/uL (ref 150–440)
RBC: 3.9 MIL/uL (ref 3.80–5.20)
RDW: 14.2 % (ref 11.5–14.5)
WBC: 8.8 10*3/uL (ref 3.6–11.0)

## 2014-07-09 LAB — MRSA PCR SCREENING: MRSA by PCR: NEGATIVE

## 2014-07-09 LAB — BASIC METABOLIC PANEL
ANION GAP: 4 — AB (ref 5–15)
BUN: 12 mg/dL (ref 6–20)
CALCIUM: 9.5 mg/dL (ref 8.9–10.3)
CO2: 26 mmol/L (ref 22–32)
Chloride: 109 mmol/L (ref 101–111)
Creatinine, Ser: 0.82 mg/dL (ref 0.44–1.00)
GFR calc Af Amer: >60 mL/min (ref >60)
GLUCOSE: 140 mg/dL — AB (ref 65–99)
Potassium: 3.4 mmol/L — ABNORMAL LOW (ref 3.5–5.1)
Sodium: 139 mmol/L (ref 135–145)

## 2014-07-09 LAB — TROPONIN I
TROPONIN I: 2.19 ng/mL — AB (ref <0.031)
Troponin I: 1.71 ng/mL — ABNORMAL HIGH (ref <0.031)
Troponin I: 2.53 ng/mL (ref <0.031)

## 2014-07-09 LAB — MAGNESIUM

## 2014-07-09 LAB — CKMB (ARMC ONLY): CK, MB: 30.1 ng/mL — ABNORMAL HIGH (ref 0.5–5.0)

## 2014-07-09 LAB — TSH: TSH: 1.49 u[IU]/mL (ref 0.350–4.500)

## 2014-07-09 SURGERY — LEFT HEART CATH AND CORONARY ANGIOGRAPHY
Anesthesia: LOCAL

## 2014-07-09 MED ORDER — SODIUM CHLORIDE 0.9 % IV SOLN: 250.0000 mL | INTRAVENOUS | Status: DC | PRN

## 2014-07-09 MED ORDER — SODIUM CHLORIDE 0.9 % IJ SOLN: 3.0000 mL | INTRAMUSCULAR | Status: AC | PRN

## 2014-07-09 MED ORDER — NITROGLYCERIN 1 MG/10 ML FOR IR/CATH LAB
INTRA_ARTERIAL | Status: AC
  Filled 2014-07-09: qty 10

## 2014-07-09 MED ORDER — MIDAZOLAM HCL 2 MG/2ML IJ SOLN
INTRAMUSCULAR | Status: DC | PRN
  Administered 2014-07-09: 1 mg via INTRAVENOUS

## 2014-07-09 MED ORDER — NITROGLYCERIN 2 % TD OINT: 1.0000 [in_us] | TOPICAL_OINTMENT | Freq: Three times a day (TID) | TRANSDERMAL | Status: DC

## 2014-07-09 MED ORDER — SODIUM CHLORIDE 0.9 % IJ SOLN: 3.0000 mL | INTRAMUSCULAR | Status: DC | PRN

## 2014-07-09 MED ORDER — SODIUM CHLORIDE 0.9 % IJ SOLN
3.0000 mL | Freq: Two times a day (BID) | INTRAMUSCULAR | Status: DC
  Administered 2014-07-09 – 2014-07-12 (×6): 3 mL via INTRAVENOUS

## 2014-07-09 MED ORDER — LEVOTHYROXINE SODIUM 100 MCG PO TABS
100.0000 ug | ORAL_TABLET | Freq: Every day | ORAL | Status: DC
  Administered 2014-07-10 – 2014-07-12 (×3): 100 ug via ORAL
  Filled 2014-07-09 (×4): qty 1

## 2014-07-09 MED ORDER — NITROGLYCERIN IN D5W 200-5 MCG/ML-% IV SOLN
0.0000 ug/min | INTRAVENOUS | Status: DC
  Administered 2014-07-09: 50 ug/min via INTRAVENOUS
  Administered 2014-07-10: 80 ug/min via INTRAVENOUS
  Filled 2014-07-09 (×2): qty 250

## 2014-07-09 MED ORDER — HEPARIN (PORCINE) IN NACL 2-0.9 UNIT/ML-% IJ SOLN
INTRAMUSCULAR | Status: AC
  Filled 2014-07-09: qty 1000

## 2014-07-09 MED ORDER — VERAPAMIL HCL 2.5 MG/ML IV SOLN
INTRAVENOUS | Status: AC
  Filled 2014-07-09: qty 2

## 2014-07-09 MED ORDER — ASPIRIN EC 81 MG PO TBEC
81.0000 mg | DELAYED_RELEASE_TABLET | Freq: Every day | ORAL | Status: DC
  Administered 2014-07-10 – 2014-07-12 (×3): 81 mg via ORAL
  Filled 2014-07-09 (×3): qty 1

## 2014-07-09 MED ORDER — ACETAMINOPHEN 325 MG PO TABS
650.0000 mg | ORAL_TABLET | ORAL | Status: DC | PRN
  Administered 2014-07-09 – 2014-07-12 (×5): 650 mg via ORAL
  Filled 2014-07-09 (×5): qty 2

## 2014-07-09 MED ORDER — NITROGLYCERIN 2 % TD OINT
1.0000 [in_us] | TOPICAL_OINTMENT | Freq: Three times a day (TID) | TRANSDERMAL | Status: DC
Start: 2014-07-09 — End: 2014-07-09
  Administered 2014-07-09: 1 [in_us] via TOPICAL
  Filled 2014-07-09: qty 1

## 2014-07-09 MED ORDER — LEVOFLOXACIN 500 MG PO TABS
500.0000 mg | ORAL_TABLET | Freq: Every day | ORAL | Status: DC
  Administered 2014-07-09 – 2014-07-12 (×4): 500 mg via ORAL
  Filled 2014-07-09 (×4): qty 1

## 2014-07-09 MED ORDER — FENTANYL CITRATE (PF) 100 MCG/2ML IJ SOLN
INTRAMUSCULAR | Status: AC
Start: 2014-07-09 — End: 2014-07-09
  Filled 2014-07-09: qty 2

## 2014-07-09 MED ORDER — TIMOLOL MALEATE 0.5 % OP SOLN
1.0000 [drp] | Freq: Two times a day (BID) | OPHTHALMIC | Status: DC
  Administered 2014-07-10 – 2014-07-12 (×4): 1 [drp] via OPHTHALMIC
  Filled 2014-07-09: qty 5

## 2014-07-09 MED ORDER — FENTANYL CITRATE (PF) 100 MCG/2ML IJ SOLN
INTRAMUSCULAR | Status: DC | PRN
  Administered 2014-07-09: 25 ug via INTRAVENOUS

## 2014-07-09 MED ORDER — HEPARIN BOLUS VIA INFUSION
4000.0000 [IU] | Freq: Once | INTRAVENOUS | Status: AC
  Administered 2014-07-09: 4000 [IU] via INTRAVENOUS
  Filled 2014-07-09: qty 4000

## 2014-07-09 MED ORDER — BRIMONIDINE TARTRATE-TIMOLOL 0.2-0.5 % OP SOLN
1.0000 [drp] | Freq: Two times a day (BID) | OPHTHALMIC | Status: DC
Start: 2014-07-09 — End: 2014-07-09

## 2014-07-09 MED ORDER — HEPARIN (PORCINE) IN NACL 100-0.45 UNIT/ML-% IJ SOLN
800.0000 [IU]/h | INTRAMUSCULAR | Status: DC
  Administered 2014-07-09: 800 [IU]/h via INTRAVENOUS
  Filled 2014-07-09: qty 250

## 2014-07-09 MED ORDER — VERAPAMIL HCL 2.5 MG/ML IV SOLN
INTRAVENOUS | Status: DC | PRN
  Administered 2014-07-09: 18:00:00 via INTRA_ARTERIAL

## 2014-07-09 MED ORDER — HEPARIN SODIUM (PORCINE) 1000 UNIT/ML IJ SOLN
INTRAMUSCULAR | Status: DC | PRN
  Administered 2014-07-09: 4000 [IU] via INTRAVENOUS

## 2014-07-09 MED ORDER — ASPIRIN 81 MG PO CHEW: 81.0000 mg | CHEWABLE_TABLET | ORAL | Status: AC

## 2014-07-09 MED ORDER — SODIUM CHLORIDE 0.9 % IV SOLN: INTRAVENOUS | Status: AC

## 2014-07-09 MED ORDER — MAGNESIUM OXIDE 400 MG PO TABS
400.0000 mg | ORAL_TABLET | Freq: Every day | ORAL | Status: DC
  Administered 2014-07-09 – 2014-07-12 (×4): 400 mg via ORAL
  Filled 2014-07-09 (×4): qty 1

## 2014-07-09 MED ORDER — ONDANSETRON HCL 4 MG/2ML IJ SOLN: 4.0000 mg | Freq: Four times a day (QID) | INTRAMUSCULAR | Status: DC | PRN

## 2014-07-09 MED ORDER — MECLIZINE HCL 25 MG PO TABS
12.5000 mg | ORAL_TABLET | Freq: Every evening | ORAL | Status: DC | PRN
  Filled 2014-07-09: qty 1

## 2014-07-09 MED ORDER — LATANOPROST 0.005 % OP SOLN
1.0000 [drp] | Freq: Every day | OPHTHALMIC | Status: DC
  Administered 2014-07-11: 1 [drp] via OPHTHALMIC
  Filled 2014-07-09 (×2): qty 2.5

## 2014-07-09 MED ORDER — NABUMETONE 500 MG PO TABS
500.0000 mg | ORAL_TABLET | Freq: Every day | ORAL | Status: DC | PRN
  Filled 2014-07-09: qty 1

## 2014-07-09 MED ORDER — NITROGLYCERIN 0.4 MG SL SUBL
0.4000 mg | SUBLINGUAL_TABLET | SUBLINGUAL | Status: DC | PRN
  Administered 2014-07-12: 0.4 mg via SUBLINGUAL
  Filled 2014-07-09: qty 1

## 2014-07-09 MED ORDER — CLOPIDOGREL BISULFATE 75 MG PO TABS
75.0000 mg | ORAL_TABLET | Freq: Every day | ORAL | Status: DC
  Administered 2014-07-10 – 2014-07-12 (×3): 75 mg via ORAL
  Filled 2014-07-09 (×4): qty 1

## 2014-07-09 MED ORDER — SODIUM CHLORIDE 0.9 % IJ SOLN: 3.0000 mL | Freq: Two times a day (BID) | INTRAMUSCULAR | Status: AC

## 2014-07-09 MED ORDER — IOHEXOL 350 MG/ML SOLN
INTRAVENOUS | Status: DC | PRN
  Administered 2014-07-09: 55 mL via INTRA_ARTERIAL

## 2014-07-09 MED ORDER — METOPROLOL TARTRATE 12.5 MG HALF TABLET
12.5000 mg | ORAL_TABLET | Freq: Two times a day (BID) | ORAL | Status: DC
  Administered 2014-07-09 – 2014-07-12 (×6): 12.5 mg via ORAL
  Filled 2014-07-09 (×7): qty 1

## 2014-07-09 MED ORDER — LIDOCAINE HCL (PF) 1 % IJ SOLN
INTRAMUSCULAR | Status: AC
  Filled 2014-07-09: qty 30

## 2014-07-09 MED ORDER — RAMIPRIL 5 MG PO CAPS: 5.0000 mg | ORAL_CAPSULE | Freq: Every day | ORAL | Status: DC

## 2014-07-09 MED ORDER — CYCLOBENZAPRINE HCL 10 MG PO TABS
10.0000 mg | ORAL_TABLET | Freq: Every evening | ORAL | Status: DC | PRN
  Administered 2014-07-12: 10 mg via ORAL
  Filled 2014-07-09: qty 1

## 2014-07-09 MED ORDER — SODIUM CHLORIDE 0.9 % IV SOLN
INTRAVENOUS | Status: DC
  Administered 2014-07-09: 10:00:00 via INTRAVENOUS

## 2014-07-09 MED ORDER — BRIMONIDINE TARTRATE 0.2 % OP SOLN
1.0000 [drp] | Freq: Two times a day (BID) | OPHTHALMIC | Status: DC
  Administered 2014-07-10 – 2014-07-12 (×4): 1 [drp] via OPHTHALMIC
  Filled 2014-07-09: qty 5

## 2014-07-09 MED ORDER — MIDAZOLAM HCL 2 MG/2ML IJ SOLN
INTRAMUSCULAR | Status: AC
  Filled 2014-07-09: qty 2

## 2014-07-09 MED ORDER — SODIUM CHLORIDE 0.9 % IV SOLN: 250.0000 mL | INTRAVENOUS | Status: AC | PRN

## 2014-07-09 MED ORDER — ATORVASTATIN CALCIUM 40 MG PO TABS
40.0000 mg | ORAL_TABLET | Freq: Every day | ORAL | Status: DC
  Administered 2014-07-10 – 2014-07-11 (×2): 40 mg via ORAL
  Filled 2014-07-09 (×3): qty 1

## 2014-07-09 MED ORDER — CLOPIDOGREL BISULFATE 300 MG PO TABS
300.0000 mg | ORAL_TABLET | Freq: Once | ORAL | Status: AC
  Administered 2014-07-09: 300 mg via ORAL
  Filled 2014-07-09: qty 1

## 2014-07-09 MED ORDER — RAMIPRIL 5 MG PO CAPS
5.0000 mg | ORAL_CAPSULE | Freq: Every day | ORAL | Status: DC
  Administered 2014-07-09: 5 mg via ORAL
  Filled 2014-07-09: qty 1

## 2014-07-09 MED ORDER — HEPARIN SODIUM (PORCINE) 1000 UNIT/ML IJ SOLN
INTRAMUSCULAR | Status: AC
  Filled 2014-07-09: qty 1

## 2014-07-09 SURGICAL SUPPLY — 15 items
CATH INFINITI 5 FR JL3.5 (CATHETERS) ×2 IMPLANT
CATH INFINITI 5FR ANG PIGTAIL (CATHETERS) ×2 IMPLANT
CATH INFINITI 5FR MULTPACK ANG (CATHETERS) IMPLANT
CATH INFINITI JR4 5F (CATHETERS) ×2 IMPLANT
DEVICE RAD COMP TR BAND LRG (VASCULAR PRODUCTS) ×2 IMPLANT
GLIDESHEATH SLEND SS 6F .021 (SHEATH) ×2 IMPLANT
KIT HEART LEFT (KITS) ×2 IMPLANT
PACK CARDIAC CATHETERIZATION (CUSTOM PROCEDURE TRAY) ×2 IMPLANT
SHEATH PINNACLE 5F 10CM (SHEATH) IMPLANT
SYR MEDRAD MARK V 150ML (SYRINGE) ×2 IMPLANT
TRANSDUCER W/STOPCOCK (MISCELLANEOUS) ×2 IMPLANT
TUBING CIL FLEX 10 FLL-RA (TUBING) ×2 IMPLANT
WIRE EMERALD 3MM-J .035X150CM (WIRE) IMPLANT
WIRE HI TORQ VERSACORE-J 145CM (WIRE) ×1 IMPLANT
WIRE SAFE-T 1.5MM-J .035X260CM (WIRE) ×2 IMPLANT

## 2014-07-09 NOTE — Progress Notes (Signed)
Dr Delton SeeNelson paged and made aware of troponin 2.53. Pt is scheduled for cardiac catheterization. Currently on amiodarone, heparin, and nitroglycerin drips. Denies chest pain at the moment. Given MD pt daughter Stephanie Key number [phone] requesting be called for mother's update..Marland Kitchen

## 2014-07-09 NOTE — H&P (Addendum)
Patient ID: ATHALENE KOLLE MRN: [id number], DOB/AGE: 1928-08-02   Admit date: 07/09/2014   Primary Physician: No PCP Per Patient Primary Cardiologist: Dr Johnsie Cancel  Pt. Profile:  NSTEMI, VT  Problem List  Past Medical History  Diagnosis Date  . Hypertension   . Thyroid disease   . Hypothyroidism   . Anxiety   . Arthritis   . GERD (gastroesophageal reflux disease)     Past Surgical History  Procedure Laterality Date  . Abdominal hysterectomy       Allergies  Allergies  Allergen Reactions  . Ceclor [Cefaclor] Other (See Comments)    Breaking out in mouth  . Darvon [Propoxyphene] Other (See Comments)    Unknown   . Hydralazine Other (See Comments)    Abdominal Pain   . Pravastatin Other (See Comments)    Muscles hurt  . Losartan Rash    Increased BP   . Sulfa Antibiotics Rash    HPI  81 y.o. Transferred from [facility] for management of NSTEMI and polymorphic VT. No previous history of CAD. Had SSCP with palpitations and presyncope. Troponin positive. In ER rhythm strip with 6 seconds of polymorphic VT. Reviewed strip and appeared almost polymorphic and torsade like. CRF HTN. Still having SSCP this am On iv amiodarone With no further VT. She is usually ambulatory no previous syncope or chest pain. Symptoms occurred suddenly on exertion going to answer the front door She is compliant with meds. No family history of sudden death. No excess ETOH She received NTG at [facility] with some chest pain relief, currently ongoing 2/10 retrosternal pressure like.   Home Medications  Prior to Admission medications   Medication Sig Start Date End Date Taking? Authorizing Provider  alendronate (FOSAMAX) 70 MG tablet Take 70 mg by mouth once a week. Take with a full glass of water on an empty stomach.  Don't lie down for 30 minutes   Yes Historical Provider, MD  aspirin EC 325 MG tablet Take 325 mg by mouth daily.   Yes Historical Provider, MD  Blood Glucose Monitoring Suppl  (FIFTY50 GLUCOSE METER 2.0) W/DEVICE KIT Use as directed. 01/25/14  Yes Historical Provider, MD  cyclobenzaprine (FLEXERIL) 10 MG tablet Take 10 mg by mouth at bedtime as needed. For muscle spasms 07/06/13  Yes Historical Provider, MD  Glucose Blood (BLOOD GLUCOSE TEST STRIPS) STRP Use 2 (two) times daily. Use as instructed. 01/25/14  Yes Historical Provider, MD  levofloxacin (LEVAQUIN) 500 MG tablet Take 500 mg by mouth daily.  06/29/14  Yes Historical Provider, MD  levothyroxine (SYNTHROID, LEVOTHROID) 100 MCG tablet Take 100 mcg by mouth daily. Take on an empty stomach with a glass of water at least 30-60 minutes before breakfast.   Yes Historical Provider, MD  magnesium oxide (MAG-OX) 400 MG tablet Take 400 mg by mouth daily.   Yes Historical Provider, MD  metoprolol tartrate (LOPRESSOR) 25 MG tablet Take 12.5 mg by mouth 2 (two) times daily.   Yes Historical Provider, MD  nitroGLYCERIN (NITROSTAT) 0.4 MG SL tablet Place 0.4 mg under the tongue every 5 (five) minutes as needed for chest pain.   Yes Historical Provider, MD  Cholecalciferol (VITAMIN D-3 PO) Take 1 tablet by mouth daily.    Historical Provider, MD  fluticasone (FLONASE) 50 MCG/ACT nasal spray Place 2 sprays into the nose daily as needed for allergies (congestion).     Historical Provider, MD  meclizine (ANTIVERT) 25 MG tablet Take 12.5-25 mg by mouth at bedtime as needed  for dizziness.  02/21/14   Historical Provider, MD  nitroGLYCERIN (NITROGLYN) 2 % ointment Apply 1 inch topically every 8 (eight) hours. 07/09/14   Rusty Aus, MD  omeprazole (PRILOSEC) 20 MG capsule Take 20 mg by mouth 2 (two) times daily.    Historical Provider, MD  ramipril (ALTACE) 5 MG capsule Take 1 capsule (5 mg total) by mouth daily. Patient not taking: Reported on 07/09/2014 07/09/14   Rusty Aus, MD    Family History  Family History  Problem Relation Age of Onset  . CAD Father     Social History  History   Social History  . Marital Status:  Married    Spouse Name: N/A  . Number of Children: N/A  . Years of Education: N/A   Occupational History  . Not on file.   Social History Main Topics  . Smoking status: Never Smoker   . Smokeless tobacco: Not on file  . Alcohol Use: No  . Drug Use: No  . Sexual Activity: Not Currently   Other Topics Concern  . Not on file   Social History Narrative  . No narrative on file     Review of Systems General:  No chills, fever, night sweats or weight changes.  Cardiovascular:  No chest pain, dyspnea on exertion, edema, orthopnea, palpitations, paroxysmal nocturnal dyspnea. Dermatological: No rash, lesions/masses Respiratory: No cough, dyspnea Urologic: No hematuria, dysuria Abdominal:   No nausea, vomiting, diarrhea, bright red blood per rectum, melena, or hematemesis Neurologic:  No visual changes, wkns, changes in mental status. All other systems reviewed and are otherwise negative except as noted above.  Physical Exam  Blood pressure 137/72, pulse 60, temperature 98.3 F (36.8 C), temperature source Oral, resp. rate 16, height '5\' 2"'  (1.575 m), weight 165 lb 11.2 oz (75.161 kg), SpO2 100 %.  General: Pleasant, NAD Psych: Normal affect. Neuro: Alert and oriented X 3. Moves all extremities spontaneously. HEENT: Normal  Neck: Supple without bruits or JVD. Lungs:  Resp regular and unlabored, CTA. Heart: RRR no s3, s4, 2/6 systolic murmur. Abdomen: Soft, non-tender, non-distended, BS + x 4.  Extremities: No clubbing, cyanosis or edema. DP/PT/Radials 2+ and equal bilaterally.  Labs  Recent Labs  07/08/14 1751 07/09/14 0544  CKMB  --  30.1*  TROPONINI <0.03 1.71*   Lab Results  Component Value Date   WBC 8.8 07/09/2014   HGB 11.9* 07/09/2014   HCT 35.3 07/09/2014   MCV 90.7 07/09/2014   PLT 211 07/09/2014    Recent Labs Lab 07/09/14 0544  NA 139  K 3.4*  CL 109  CO2 26  BUN 12  CREATININE 0.82  CALCIUM 9.5  GLUCOSE 140*     Radiology/Studies  Dg Chest  Port 1 View  07/08/2014   CLINICAL DATA:  Acute onset of centralized chest pain. Initial encounter.  EXAM: PORTABLE CHEST - 1 VIEW  COMPARISON:  Chest radiograph performed 09/10/2013  FINDINGS: The lungs are well-aerated. Mild scarring is noted at the lung bases. Pulmonary vascularity is at the upper limits of normal. There is no evidence of pleural effusion or pneumothorax. An external pacing pad is noted on the left.  The cardiomediastinal silhouette is enlarged. No acute osseous abnormalities are seen.  IMPRESSION: Mild bibasilar scarring.  Cardiomegaly noted.   Electronically Signed   By: Garald Balding M.D.   On: 07/08/2014 19:18   Echocardiogram 07/09/14  - Left ventricle: The cavity size was normal. Systolic function was normal. The estimated ejection fraction  was in the range of 55% to 60%. - Aortic valve: There was mild to moderate stenosis. Valve area (Vmax): 1.14 cm^2. - Mitral valve: There was mild regurgitation.  ECG: none in epic    ASSESSMENT AND PLAN  1. NSTEMI - the patient has uptrending troponin 0.03 -> 1.7 with ongoing chest pain, came without heparin from [facility] - start ASA, heparin drip - unable to start BB as she is bradycardic - start NTG drip - we will order troponin, ECG, consider cath today  2. Polymorphic VT - on amiodarone drip, if continues we would consider Lidocain  3. HTN - controlled  The patient will be transferred to the stepdown unit.   DVT PPX - Heparin drip   Signed, Dorothy Spark, MD, [facility] 07/09/2014, 1:40 PM

## 2014-07-09 NOTE — Progress Notes (Signed)
Transferred to [place] under Dr. Nelson as welDelton Key. Pt husband and daughter, [name] aware over phone. Transported by [facility]. All forms and copies of EKG given to [facility] nurse. Pt went to floor 3 [address] room 23. All belongings given to pt. Report to nurse Paulette. VSS upon transport.

## 2014-07-09 NOTE — Discharge Summary (Signed)
                                                                                    Stephanie Key, is a 81 y.o. female  DOB 01/14/1929  MRN [id number].  Admission date:  07/08/2014  Admitting Physician  Gale Journeyatherine P Walsh, MD  Discharge Date:  07/09/2014    Admission Diagnosis  Ventricular tachycardia seen on cardiac monitor [I47.2] Chest pain, unspecified chest pain type [R07.9]  Discharge Diagnoses  acute NST MI Ischemic ventricular tachycardia Hypertension Hypothyroid GERD   Past Medical History  Diagnosis Date  . Hypertension   . Thyroid disease   . Hypothyroidism   . Anxiety   . Arthritis   . GERD (gastroesophageal reflux disease)     Past Surgical History  Procedure Laterality Date  . Abdominal hysterectomy         History of present illness and  Hospital Course:     Kindly see H&P for history of present illness and admission details, please review complete Labs, Consult reports and Test reports for all details in brief  HPI  from the history and physical done on the day of admission    Hospital Course    Patient admitted with chest discomfort, ventricular tachycardia. Patient with abnormal troponins with persisting chest pain, [facility] per cardiology.   Discharge Condition: Stable   Follow UP  Dr. Judithann SheenSparks    Discharge Instructions  and  Discharge Medications  Aspirin 325 mg daily Synthroid 100 g daily Amiodarone IV Magnesium oxide 400 mg daily Metoprolol tartrate 25 g twice a day Nitropaste 1 inch topical every 8 Omeprazole 20 mg daily Ramipril 5 g daily   Total Time in preparing paper work, data evaluation and todays exam - 35 minutes  MILLER,MARK F. M.D on 07/09/2014 at 9:14 AM

## 2014-07-09 NOTE — Progress Notes (Signed)
Patient arrived from cath lab on amiodarone gtt. Orders for amiodarone expired at 1600. Patient has not had any ectopy since arrival from cath lab and VSS. Notified Dr. Antoine PocheHochrein, orders to discontinue amiodarone gtt.  Will continue to monitor.

## 2014-07-09 NOTE — Progress Notes (Signed)
Called to pt's room for right radial bleed.  Added 3 cc to balloon.  2 plus Pulse noted right radial.  Educated pt, call bell in reach. Will continue to monitor. Karena Addisonoro, Taye Cato T

## 2014-07-09 NOTE — Progress Notes (Signed)
Received patient into room 3W23 from [facility]. Awake, alert, and Conversant.  Denies chest pain, 0 on scale 0-10 post morphine given [facility] rout [facility] Dr Andreas OhmKatrina Nelson receiving MD paged and made aware of arrival and patient location.Marland Kitchen. Awaiting admission orders.

## 2014-07-09 NOTE — Progress Notes (Signed)
Have talked with dr Clent Ridgeswalsh about pt bp and also order needed for amino pt was given labetolol and order given for amino .me

## 2014-07-09 NOTE — Progress Notes (Signed)
Critical trop 2.19 called from lab.  Md already aware of trop level elevated of 2.53. Will continue to monitor. Karena Addisonoro, Jayvion Stefanski T

## 2014-07-09 NOTE — Progress Notes (Signed)
*  PRELIMINARY RESULTS* °Echocardiogram °2D Echocardiogram has been performed. ° °Stephanie Key °07/09/2014, 9:12 AM °

## 2014-07-09 NOTE — Progress Notes (Signed)
Stephanie Key is a 81 y.o. female   SUBJECTIVE:  Patient admitted with chest pain, episodic ventricular tachycardia. Started on IV amiodarone during no V. tach overnight. Still patient with 3/10 substernal chest discomfort. Recent troponin elevated to 1.2.  ______________________________________________________________________  ROS: Review of systems is unremarkable for any active cardiac,respiratory, GI, GU, hematologic, neurologic or psychiatric systems, 10 systems reviewed.  Marland Kitchen. aspirin  324 mg Oral Once  . aspirin EC  325 mg Oral Daily  . heparin  5,000 Units Subcutaneous 3 times per day  . levothyroxine  100 mcg Oral QAC breakfast  . metoprolol tartrate  25 mg Oral BID  . nitroGLYCERIN  1 inch Topical 3 times per day  . pantoprazole  40 mg Oral Daily  . ramipril  5 mg Oral Daily   acetaminophen **OR** acetaminophen, cyclobenzaprine, labetalol, polyethylene glycol   Past Medical History  Diagnosis Date  . Hypertension   . Thyroid disease   . Hypothyroidism   . Anxiety   . Arthritis   . GERD (gastroesophageal reflux disease)     Past Surgical History  Procedure Laterality Date  . Abdominal hysterectomy      PHYSICAL EXAM:  BP 162/80 mmHg  Pulse 69  Temp(Src) 98.5 F (36.9 C)  Resp 24  Ht 5\' 2"  (1.575 m)  Wt 79.379 kg (175 lb)  BMI 32.00 kg/m2  SpO2 97%  LMP  (LMP Unknown)  Wt Readings from Last 3 Encounters:  07/08/14 79.379 kg (175 lb)           BP Readings from Last 3 Encounters:  07/09/14 162/80    Constitutional: NAD Neck: supple, no thyromegaly Respiratory: CTA, no rales or wheezes Cardiovascular: RRR, no murmur, no gallop Abdomen: soft, good BS, nontender Extremities: no edema Neuro: alert and oriented, no focal motor or sensory deficits  ASSESSMENT/PLAN:  Labs and imaging studies were reviewed  NSTEMI/ventricular tachycardia-patient with current continued ischemia. Add nitro paste, continue aspirin/heparin, continue beta blocker, add ACE  inhibitor, cardiology consult pending, needs heart catheter today  Hypertension-ACE inhibitor, Nitropaste  Hyponatremia-low-dose IV fluids, LV function preserved on echo, follow mag

## 2014-07-09 NOTE — Progress Notes (Signed)
Took off right TR band and placed pressure dressing to right radial.  Bruising noted level 1.  Educated pt.  Right arm elevated.  Call bell in reach.  Will continue to monitor.  Karena Addisonoro, Demetra Moya T

## 2014-07-09 NOTE — Consult Note (Signed)
CARDIOLOGY CONSULT NOTE       Patient ID: Stephanie MouldDeloris R Key MRN: [id number] DOB/AGE: 79/10/1928 79 y.o.  Admit date: 07/08/2014 Referring Physician:  Hyacinth MeekerMiller Primary Physician: No PCP Per Patient Primary Cardiologist:  New/  Will follow in [place] Reason for Consultation:  SEMI/VT  Active Problems:   Ventricular tachycardia   Hyponatremia   HPI:  81 y.o. no previous history of CAD.  Had SSCP with palpitations and presyncope.  Troponin positive.  In ER rhythm strip with 6 seconds of VT.  Reviewed strip and appeared almost polymorphic and torsade like CRF HTN.  Still having SSCP this am  On iv amiodarone  With no further VT.  Patients husband use to see Tom Wall.  She is usually ambulatory no previous syncope or chest pain.  Symptoms occurred suddenly on exertion going to answer the front door  She is compliant with meds.  No family history of sudden death. No excess ETOH  ROS All other systems reviewed and negative except as noted above  Past Medical History  Diagnosis Date  . Hypertension   . Thyroid disease   . Hypothyroidism   . Anxiety   . Arthritis   . GERD (gastroesophageal reflux disease)     Family History  Problem Relation Age of Onset  . CAD Father     History   Social History  . Marital Status: Married    Spouse Name: N/A  . Number of Children: N/A  . Years of Education: N/A   Occupational History  . Not on file.   Social History Main Topics  . Smoking status: Never Smoker   . Smokeless tobacco: Not on file  . Alcohol Use: No  . Drug Use: No  . Sexual Activity: Not Currently   Other Topics Concern  . Not on file   Social History Narrative  . No narrative on file    Past Surgical History  Procedure Laterality Date  . Abdominal hysterectomy       . aspirin  324 mg Oral Once  . aspirin EC  325 mg Oral Daily  . heparin  5,000 Units Subcutaneous 3 times per day  . levothyroxine  100 mcg Oral QAC breakfast  . metoprolol tartrate  25 mg Oral  BID  . nitroGLYCERIN  1 inch Topical 3 times per day  . pantoprazole  40 mg Oral Daily  . ramipril  5 mg Oral Daily   . sodium chloride    . amiodarone 30 mg/hr (07/09/14 0551)    Physical Exam: Blood pressure 162/80, pulse 69, temperature 98.5 F (36.9 C), resp. rate 24, height 5\' 2"  (1.575 m), weight 175 lb (79.379 kg), SpO2 97 %.   Affect appropriate Obese white female  HEENT: normal Neck supple with no adenopathy JVP normal no bruits no thyromegaly Lungs clear with no wheezing and good diaphragmatic motion Heart:  S1/S2 no murmur, no rub, gallop or click PMI normal Abdomen: benighn, BS positve, no tenderness, no AAA no bruit.  No HSM or HJR Distal pulses intact with no bruits No edema Neuro non-focal Skin warm and dry No muscular weakness   Labs:   Lab Results  Component Value Date   WBC 8.8 07/09/2014   HGB 11.9* 07/09/2014   HCT 35.3 07/09/2014   MCV 90.7 07/09/2014   PLT 211 07/09/2014    Recent Labs Lab 07/09/14 0544  NA 139  K 3.4*  CL 109  CO2 26  BUN 12  CREATININE 0.82  CALCIUM 9.5  GLUCOSE 140*   Lab Results  Component Value Date   CKMB 30.1* 07/09/2014   TROPONINI 1.71* 07/09/2014   No results found for: CHOL No results found for: HDL No results found for: LDLCALC No results found for: TRIG No results found for: CHOLHDL No results found for: LDLDIRECT    Radiology: Dg Chest Port 1 View  07/08/2014   CLINICAL DATA:  Acute onset of centralized chest pain. Initial encounter.  EXAM: PORTABLE CHEST - 1 VIEW  COMPARISON:  Chest radiograph performed 09/10/2013  FINDINGS: The lungs are well-aerated. Mild scarring is noted at the lung bases. Pulmonary vascularity is at the upper limits of normal. There is no evidence of pleural effusion or pneumothorax. An external pacing pad is noted on the left.  The cardiomediastinal silhouette is enlarged. No acute osseous abnormalities are seen.  IMPRESSION: Mild bibasilar scarring.  Cardiomegaly noted.    Electronically Signed   By: Roanna Raider M.D.   On: 07/08/2014 19:18    EKG:  NSR rate 92 poor R wave progression possible anterior MI  QT 362   ASSESSMENT AND PLAN:  Chest Pain:  SEMI  Continue heparin and nitro cath latter today if troponins go higher.  She is fine with being transferred to [facility]  Continue beta blocker VT:  Appears polymorphic  Likely from ischemia  Further w/u pending coronary anatomy and possible revascularization  Continue amiodarone Supple K check Mg HTN:  Well controlled.  Continue current medications and low sodium Dash type diet.    Have called [facility] , Dr Hyacinth Meeker to d/c and spoke with receiving  Dr Delton See at [facility]   Signed: Charlton Haws 07/09/2014, 9:09 AM

## 2014-07-09 NOTE — Progress Notes (Signed)
ANTICOAGULATION CONSULT NOTE - Initial Consult  Pharmacy Consult for Heparin Indication: chest pain/ACS  Allergies  Allergen Reactions  . Ceclor [Cefaclor] Other (See Comments)    Breaking out in mouth  . Darvon [Propoxyphene] Other (See Comments)    Unknown   . Hydralazine Other (See Comments)    Abdominal Pain   . Pravastatin Other (See Comments)    Muscles hurt  . Losartan Rash    Increased BP   . Sulfa Antibiotics Rash    Patient Measurements: Height: 5\' 2"  (157.5 cm) Weight: 165 lb 11.2 oz (75.161 kg) IBW/kg (Calculated) : 50.1 Heparin Dosing Weight: 66.4 kg  Vital Signs: Temp: 98.3 F (36.8 C) (05/28 1140) Temp Source: Oral (05/28 1140) BP: 137/72 mmHg (05/28 1140) Pulse Rate: 60 (05/28 1140)  Labs:  Recent Labs  07/08/14 1751 07/08/14 1946 07/09/14 0544  HGB  --  12.5 11.9*  HCT  --  37.4 35.3  PLT  --  233 211  CREATININE 0.88  --  0.82  CKMB  --   --  30.1*  TROPONINI <0.03  --  1.71*    Estimated Creatinine Clearance: 48.5 mL/min (by C-G formula based on Cr of 0.82).   Medical History: Past Medical History  Diagnosis Date  . Hypertension   . Thyroid disease   . Hypothyroidism   . Anxiety   . Arthritis   . GERD (gastroesophageal reflux disease)     Medications:  Scheduled:  . aspirin EC  81 mg Oral Daily  . atorvastatin  40 mg Oral q1800   Infusions:  . nitroGLYCERIN      Assessment: 81 [facility] F [facility] with palpitations and presyncope. Troponin elevated with runs of NSVT. Pharmacy has been consulted to dose heparin for ACS event. H/H low, plt wnl with no reported significant s/s bleeding.   Goal of Therapy:  Heparin level 0.3-0.7 units/ml Monitor platelets by anticoagulation protocol: Yes   Plan:  - Initiate heparin 4000 units x 1, followed by 800 units/hr (~12u/kg/hr) - 8 hour HL then daily HL/CBC - Monitor for s/s bleeding - F/u plans for cath  [facility] Medical CenterErika K. Bonnye FavaNicolsen, PharmD, BCPS Clinical Pharmacist -  Resident Pager: [phone] Pharmacy: [phone] 07/09/2014 2:02 PM

## 2014-07-09 NOTE — Progress Notes (Signed)
Mrs. Orson SlickBowman [facility] this morning under the care of Dr. Eden EmmsNishan (cardiologist)

## 2014-07-10 DIAGNOSIS — I209 Angina pectoris, unspecified: Secondary | ICD-10-CM

## 2014-07-10 DIAGNOSIS — E785 Hyperlipidemia, unspecified: Secondary | ICD-10-CM

## 2014-07-10 LAB — CBC
HEMATOCRIT: 32.8 % — AB (ref 36.0–46.0)
Hemoglobin: 10.5 g/dL — ABNORMAL LOW (ref 12.0–15.0)
MCH: 28.9 pg (ref 26.0–34.0)
MCHC: 32 g/dL (ref 30.0–36.0)
MCV: 90.4 fL (ref 78.0–100.0)
PLATELETS: 246 10*3/uL (ref 150–400)
RBC: 3.63 MIL/uL — ABNORMAL LOW (ref 3.87–5.11)
RDW: 13.9 % (ref 11.5–15.5)
WBC: 9.4 10*3/uL (ref 4.0–10.5)

## 2014-07-10 LAB — TROPONIN I: TROPONIN I: 1.87 ng/mL — AB (ref <0.031)

## 2014-07-10 MED ORDER — DOCUSATE SODIUM 100 MG PO CAPS
100.0000 mg | ORAL_CAPSULE | Freq: Two times a day (BID) | ORAL | Status: DC | PRN
  Administered 2014-07-10: 100 mg via ORAL
  Filled 2014-07-10 (×3): qty 1

## 2014-07-10 MED ORDER — RANOLAZINE ER 500 MG PO TB12
500.0000 mg | ORAL_TABLET | Freq: Two times a day (BID) | ORAL | Status: DC
  Administered 2014-07-10 – 2014-07-12 (×5): 500 mg via ORAL
  Filled 2014-07-10 (×6): qty 1

## 2014-07-10 MED ORDER — MORPHINE SULFATE 2 MG/ML IJ SOLN
1.0000 mg | Freq: Once | INTRAMUSCULAR | Status: AC
  Administered 2014-07-10: 1 mg via INTRAVENOUS

## 2014-07-10 MED ORDER — PANTOPRAZOLE SODIUM 40 MG PO TBEC
40.0000 mg | DELAYED_RELEASE_TABLET | Freq: Every day | ORAL | Status: DC
  Administered 2014-07-10 – 2014-07-12 (×3): 40 mg via ORAL
  Filled 2014-07-10 (×3): qty 1

## 2014-07-10 MED ORDER — MORPHINE SULFATE 2 MG/ML IJ SOLN
1.0000 mg | INTRAMUSCULAR | Status: DC | PRN
Start: 2014-07-10 — End: 2014-07-12

## 2014-07-10 MED ORDER — ALUM & MAG HYDROXIDE-SIMETH 200-200-20 MG/5ML PO SUSP
30.0000 mL | ORAL | Status: DC | PRN
  Administered 2014-07-10: 30 mL via ORAL
  Filled 2014-07-10: qty 30

## 2014-07-10 MED ORDER — MORPHINE SULFATE 2 MG/ML IJ SOLN
INTRAMUSCULAR | Status: AC
  Filled 2014-07-10: qty 1

## 2014-07-10 MED ORDER — ISOSORBIDE MONONITRATE ER 30 MG PO TB24
30.0000 mg | ORAL_TABLET | Freq: Every day | ORAL | Status: DC
  Administered 2014-07-10 – 2014-07-12 (×3): 30 mg via ORAL
  Filled 2014-07-10 (×3): qty 1

## 2014-07-10 MED ORDER — NITROGLYCERIN IN D5W 200-5 MCG/ML-% IV SOLN
0.0000 ug/min | INTRAVENOUS | Status: AC
  Administered 2014-07-10: 70 ug/min via INTRAVENOUS

## 2014-07-10 MED ORDER — TEMAZEPAM 7.5 MG PO CAPS
7.5000 mg | ORAL_CAPSULE | Freq: Every evening | ORAL | Status: AC | PRN
  Administered 2014-07-10: 7.5 mg via ORAL
  Filled 2014-07-10: qty 1

## 2014-07-10 NOTE — Progress Notes (Signed)
Per Pt request for PRN medications for stool softner, prilosec, and anxiety.  Notified Md.  Vs stable will continue to monitor. Karena Addisonoro, Anvi Mangal T

## 2014-07-10 NOTE — Progress Notes (Signed)
Called to pt's room for mid chest pain. Pt stated " feels like I need to burp"   Stat ekg obtained no changes noted.  Increased ntg gtt, placed on 4lnc, gave maalox po. Pt stated " fells better"  After maalox given.  bp stable.  Will continue to monitor. Karena Addisonoro, Gissele Narducci T

## 2014-07-10 NOTE — Progress Notes (Addendum)
Patient Name: Stephanie Key Date of Encounter: 07/10/2014  Active Problems:   NSTEMI (non-ST elevated myocardial infarction)   Length of Stay: 1  SUBJECTIVE  The patient has ongoing chest pain across her chest.  CURRENT MEDS . aspirin EC  81 mg Oral Daily  . atorvastatin  40 mg Oral q1800  . brimonidine  1 drop Both Eyes Q12H  . clopidogrel  75 mg Oral Q breakfast  . latanoprost  1 drop Both Eyes QHS  . levofloxacin  500 mg Oral Daily  . levothyroxine  100 mcg Oral QAC breakfast  . magnesium oxide  400 mg Oral Daily  . metoprolol tartrate  12.5 mg Oral BID  . sodium chloride  3 mL Intravenous Q12H  . timolol  1 drop Both Eyes BID   OBJECTIVE  Filed Vitals:   07/10/14 0530 07/10/14 0600 07/10/14 0615 07/10/14 0800  BP: 119/54 116/54 127/51 103/47  Pulse:      Temp:    98.1 F (36.7 C)  TempSrc:    Oral  Resp: 18 18 20 16   Height:      Weight:      SpO2: 96% 96% 91% 93%    Intake/Output Summary (Last 24 hours) at 07/10/14 0906 Last data filed at 07/10/14 0800  Gross per 24 hour  Intake 500.25 ml  Output    550 ml  Net -49.75 ml   Filed Weights   07/09/14 1140 07/09/14 1830  Weight: 165 lb 11.2 oz (75.161 kg) 162 lb 11.2 oz (73.8 kg)   PHYSICAL EXAM  General: Pleasant, NAD. Neuro: Alert and oriented X 3. Moves all extremities spontaneously. Psych: Normal affect. HEENT:  Normal  Neck: Supple without bruits or JVD. Lungs:  Resp regular and unlabored, CTA. Heart: RRR no s3, s4, or murmurs. Abdomen: Soft, non-tender, non-distended, BS + x 4.  Extremities: No clubbing, cyanosis or edema. DP/PT/Radials 2+ and equal bilaterally.  Accessory Clinical Findings  CBC  Recent Labs  07/08/14 1946 07/09/14 0544 07/10/14 0048  WBC 11.2* 8.8 9.4  NEUTROABS 8.7*  --   --   HGB 12.5 11.9* 10.5*  HCT 37.4 35.3 32.8*  MCV 90.0 90.7 90.4  PLT 233 211 246   Basic Metabolic Panel  Recent Labs  07/08/14 1751 07/09/14 0544  NA 129* 139  K 3.7 3.4*  CL  98* 109  CO2 25 26  GLUCOSE 148* 140*  BUN 15 12  CREATININE 0.88 0.82  CALCIUM 9.5 9.5  MG >21.0*  --    Cardiac Enzymes  Recent Labs  07/09/14 0544 07/09/14 1441 07/09/14 1934 07/10/14 0048  CKMB 30.1*  --   --   --   TROPONINI 1.71* 2.53* 2.19* 1.87*    Recent Labs  07/08/14 1751  TSH 1.490   Radiology/Studies  Dg Chest Port 1 View  07/08/2014   CLINICAL DATA:  Acute onset of centralized chest pain. Initial encounter.  EXAM: PORTABLE CHEST - 1 VIEW  COMPARISON:  Chest radiograph performed 09/10/2013  FINDINGS: The lungs are well-aerated. Mild scarring is noted at the lung bases. Pulmonary vascularity is at the upper limits of normal. There is no evidence of pleural effusion or pneumothorax. An external pacing pad is noted on the left.  The cardiomediastinal silhouette is enlarged. No acute osseous abnormalities are seen.  IMPRESSION: Mild bibasilar scarring.  Cardiomegaly noted.   Electronically Signed   By: Roanna RaiderJeffery  Chang M.D.   On: 07/08/2014 19:18   TELE: SR  Left cardiac  cath: 07/09/2014 Dominance: Right   Left Anterior Descending  There is moderatethe vessel.   Royden Purl LAD lesion, 60% stenosed. calcified, diffuse .   Marland Kitchen First Diagonal Branch   The vessel is small in size.   . 1st Diag lesion, 95% stenosed. diffuse . Tiny vessel   . Third Diagonal Branch   The vessel is small in size.   Suezanne Jacquet 3rd Diag lesion, 90% stenosed. Medium-caliber vessel with severe ostial stenosis     Left Circumflex  There is mildthe vessel.     Right Coronary Artery   . Prox RCA lesion, 50% stenosed. diffuse, eccentric .   Marland Kitchen Mid RCA lesion, 30% stenosed. diffuse .   Marland Kitchen Second Right Posterolateral   . 2nd RPLB lesion, 70% stenosed.        ASSESSMENT AND PLAN  1. NSTEMI - the patient has uptrending troponin 0.03 -> 2.5 -> 1.8 - cath showed severe diffuse small vessel disease  Primarily involving the diagonal branches of the LAD, distal LAD and PLA of the RCA - aggressive  medical therapy is recommended - continue asa, plavix, high dose atorvastatin, low dose metoprolol - she has ongoing chest pain, we will start imdur 30 mg po daily and ranolazine 500 mg po BID  2. Polymorphic VT - resolved, d/c amiodarone  3. HTN - controlled    Signed, Lars Masson MD, [facility] 07/10/2014

## 2014-07-10 NOTE — Progress Notes (Signed)
Md notified of pt's c/o pain.  maalox helped earlier however pt wakes up  with 5/10 chest pain.  o2 on pt.  Titrate ntg gtt bp 140/59 hr 70.  Pt anxious.  New orders received. Will continue to monitor. Stephanie Key, Latiya Navia T

## 2014-07-11 ENCOUNTER — Encounter (HOSPITAL_COMMUNITY): Payer: Self-pay | Admitting: General Practice

## 2014-07-11 DIAGNOSIS — I214 Non-ST elevation (NSTEMI) myocardial infarction: Principal | ICD-10-CM

## 2014-07-11 LAB — CBC
HCT: 34.5 % — ABNORMAL LOW (ref 36.0–46.0)
Hemoglobin: 11.2 g/dL — ABNORMAL LOW (ref 12.0–15.0)
MCH: 29.2 pg (ref 26.0–34.0)
MCHC: 32.5 g/dL (ref 30.0–36.0)
MCV: 90.1 fL (ref 78.0–100.0)
PLATELETS: 229 10*3/uL (ref 150–400)
RBC: 3.83 MIL/uL — ABNORMAL LOW (ref 3.87–5.11)
RDW: 13.7 % (ref 11.5–15.5)
WBC: 8.8 10*3/uL (ref 4.0–10.5)

## 2014-07-11 NOTE — Care Management Note (Signed)
Case Management Note  Patient Details  Name: Stephanie Key MRN: [id number] Date of Birth: 06/04/1928  Subjective/Objective:     Lives at home with husband who is on hospice and has [facility] assist also.  [place] is [place] now and will be with them 24/7 until she leaves and then will have [facility] set up 24/7 if needed.    Appears plan is for discharge tomorrow.  PT recommending HH PT.  [facility] and patient would like to use also.  Referral made.  Will need face to face and HH orders placed prior to discharge.              Action/Plan:   Expected Discharge Date:                  Expected Discharge Plan:  Home w Home Health Services  In-House Referral:     Discharge planning Services     Post Acute Care Choice:    Choice offered to:     DME Arranged:    DME Agency:     HH Arranged:    HH Agency:     Status of Service:  In process, will continue to follow  Medicare Important Message Given:    Date Medicare IM Given:    Medicare IM give by:    Date Additional Medicare IM Given:    Additional Medicare Important Message give by:     If discussed at Long Length of Stay Meetings, dates discussed:    Additional Comments:  [name], [name] [name], RN 07/11/2014, 2:04 PM

## 2014-07-11 NOTE — Progress Notes (Signed)
After speaking with pt at length about ADL'S at home.  Pt feels unable to perform all ADL's at this time.  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   The husband receives  Hospice and [facility] service.  She feels she will need more help at home to care for herself and husband.  At home she has been having difficulty getting husband up. Placed consult for social worker,  per conversation with MD.   Karena Addisonoro, Gertrude Tarbet T

## 2014-07-11 NOTE — Progress Notes (Signed)
Patient ID: Stephanie Key, female   DOB: 10/26/1928, 81 y.o.   MRN: [id number]     Subjective:  Contnues to complain of atypical breast pain   Objective:  Filed Vitals:   07/10/14 2207 07/10/14 2327 07/11/14 0301 07/11/14 0809  BP: 166/57 158/67 159/68 192/73  Pulse: 94     Temp:  98.4 F (36.9 C) 98.3 F (36.8 C) 97.9 F (36.6 C)  TempSrc:  Oral Oral Oral  Resp:  21 17 17   Height:      Weight:      SpO2:  96% 96% 96%    Intake/Output from previous day:  Intake/Output Summary (Last 24 hours) at 07/11/14 0859 Last data filed at 07/11/14 0253  Gross per 24 hour  Intake  852.8 ml  Output   2500 ml  Net -1647.2 ml    Affect appropriate Healthy:  appears stated age HEENT: normal Neck supple with no adenopathy JVP normal no bruits no thyromegaly Lungs clear with no wheezing and good diaphragmatic motion Heart:  S1/S2 no murmur, no rub, gallop or click PMI normal Abdomen: benighn, BS positve, no tenderness, no AAA no bruit.  No HSM or HJR Distal pulses intact with no bruits No edema Neuro non-focal Skin warm and dry No muscular weakness   Recent Labs  07/08/14 1751 07/09/14 0544  NA 129* 139  K 3.7 3.4*  CL 98* 109  CO2 25 26  GLUCOSE 148* 140*  BUN 15 12  CREATININE 0.88 0.82  CALCIUM 9.5 9.5  MG >21.0*  --    Liver Function Tests: No results for input(s): AST, ALT, ALKPHOS, BILITOT, PROT, ALBUMIN in the last 72 hours. No results for input(s): LIPASE, AMYLASE in the last 72 hours. CBC:  Recent Labs  07/08/14 1946  07/10/14 0048 07/11/14 0240  WBC 11.2*  < > 9.4 8.8  NEUTROABS 8.7*  --   --   --   HGB 12.5  < > 10.5* 11.2*  HCT 37.4  < > 32.8* 34.5*  MCV 90.0  < > 90.4 90.1  PLT 233  < > 246 229  < > = values in this interval not displayed. Cardiac Enzymes:  Recent Labs  07/09/14 0544 07/09/14 1441 07/09/14 1934 07/10/14 0048  CKMB 30.1*  --   --   --   TROPONINI 1.71* 2.53* 2.19* 1.87*   Thyroid Function Tests:  Recent Labs  07/08/14 1751  TSH 1.490   Anemia Panel: No results for input(s): VITAMINB12, FOLATE, FERRITIN, TIBC, IRON, RETICCTPCT in the last 72 hours.  Imaging: No results found.  Cardiac Studies:  ECG:  SR LAD no acute ST changes   Telemetry:  NSR no arrhythmia   Echo:  EF 55-60% mild MR   Medications:   . aspirin EC  81 mg Oral Daily  . atorvastatin  40 mg Oral q1800  . brimonidine  1 drop Both Eyes Q12H  . clopidogrel  75 mg Oral Q breakfast  . isosorbide mononitrate  30 mg Oral Daily  . latanoprost  1 drop Both Eyes QHS  . levofloxacin  500 mg Oral Daily  . levothyroxine  100 mcg Oral QAC breakfast  . magnesium oxide  400 mg Oral Daily  . metoprolol tartrate  12.5 mg Oral BID  . pantoprazole  40 mg Oral Daily  . ranolazine  500 mg Oral BID  . sodium chloride  3 mL Intravenous Q12H  . timolol  1 drop Both Eyes BID  Assessment/Plan:  SEMI:  Diffuse small vessel disease of diagonals and PLB  Medical Rx  Continue ASA/Plavix isordil and ranexa.  Transfer to telemetry  Probable d/c in am  Outpatient f/u in [place] with [facility] or [facility].  Initially transferred from [facility]  VT: polymorphic is setting of ischemia non recurrent continue beta blocker and Rx for CAD  Do not think AICD indicated given age, normal EF and stability on antianginal meds  Charlton Haws 07/11/2014, 8:59 AM

## 2014-07-11 NOTE — Care Management Note (Signed)
Case Management Note  Patient Details  Name: Stephanie Key MRN: [id number] Date of Birth: 05/26/1928  Subjective/Objective:      Adm w ch pain,nstemi              Action/Plan: lives w husband   Expected Discharge Date:                  Expected Discharge Plan:  Home/Self Care  In-House Referral:     Discharge planning Services     Post Acute Care Choice:    Choice offered to:     DME Arranged:    DME Agency:     HH Arranged:    HH Agency:     Status of Service:     Medicare Important Message Given:    Date Medicare IM Given:    Medicare IM give by:    Date Additional Medicare IM Given:    Additional Medicare Important Message give by:     If discussed at Long Length of Stay Meetings, dates discussed:    Additional Comments: ur review done  Hanley HaysDowell, Demonie Kassa T, RN 07/11/2014, 10:46 AM

## 2014-07-11 NOTE — Evaluation (Signed)
Physical Therapy Evaluation Patient Details Name: Stephanie MouldDeloris R Key MRN: [id number] DOB: 08/10/1928 Today's Date: 07/11/2014   History of Present Illness  81 y.o. [facility] management of NSTEMI and polymorphic VT. Hx of HTN  Clinical Impression  Pt pleasant and willing to ambulate but required assist, cueing and RW for safety with gait. Pt will need assist currently at home to care for herself let alone for her husband. If family can provide assist or arrange additional assist at home then D/C with HHPT viable but if not ST-SNF. Pt with decreased strength, function and activity tolerance who will benefit from acute therapy to maximize mobility, function and activity tolerance.     Follow Up Recommendations Home health PT;Supervision for mobility/OOB (If assist during the day can be arranged HHPT recommended but if not ST-SNF may be necessary)    Equipment Recommendations  Rolling walker with 5" wheels (short RW)    Recommendations for Other Services       Precautions / Restrictions Precautions Precautions: Fall      Mobility  Bed Mobility               General bed mobility comments: in chair on arrival  Transfers Overall transfer level: Needs assistance   Transfers: Sit to/from Stand Sit to Stand: Min guard         General transfer comment: cues for hand placement and sequence from chair &  BSC, 3 total trials  Ambulation/Gait Ambulation/Gait assistance: Min guard Ambulation Distance (Feet): 150 Feet Assistive device: Rolling walker (2 wheeled) Gait Pattern/deviations: Shuffle;Drifts right/left;Trunk flexed   Gait velocity interpretation: Below normal speed for age/gender General Gait Details: cues for posture, position in RW, directional cues. Pt veering bil throughout with tendency to stay too posterior to RW  Stairs            Wheelchair Mobility    Modified Rankin (Stroke Patients Only)       Balance Overall balance assessment:  Needs assistance   Sitting balance-Leahy Scale: Good       Standing balance-Leahy Scale: Poor                               Pertinent Vitals/Pain Pain Assessment: No/denies pain  HR 78-100 with activity sats 99% on RA BP 185/86 (119) before activity BP 192/73 (113) after activity     Home Living Family/patient expects to be discharged to:: Private residence Living Arrangements: Spouse/significant other   Type of Home: House Home Access: Level entry     Home Layout: One level Home Equipment: Bedside commode;Tub bench      Prior Function Level of Independence: Independent         Comments: pt normally cares for herself and helps care for spouse who is assisted by hospice and [facility]     Hand Dominance        Extremity/Trunk Assessment   Upper Extremity Assessment: Generalized weakness           Lower Extremity Assessment: Generalized weakness      Cervical / Trunk Assessment: Kyphotic  Communication   Communication: No difficulties  Cognition Arousal/Alertness: Awake/alert Behavior During Therapy: WFL for tasks assessed/performed Overall Cognitive Status: Within Functional Limits for tasks assessed                      General Comments      Exercises  Assessment/Plan    PT Assessment Patient needs continued PT services  PT Diagnosis Difficulty walking;Generalized weakness   PT Problem List Decreased strength;Decreased activity tolerance;Decreased balance;Decreased mobility;Decreased knowledge of use of DME  PT Treatment Interventions DME instruction;Gait training;Functional mobility training;Therapeutic activities;Therapeutic exercise;Patient/family education   PT Goals (Current goals can be found in the Care Plan section) Acute Rehab PT Goals Patient Stated Goal: return home PT Goal Formulation: With patient Time For Goal Achievement: 07/25/14 Potential to Achieve Goals: Good    Frequency Min 3X/week    Barriers to discharge Decreased caregiver support dgtr visiting from [place] but unclear how long she can stay or if additional help available at home    Co-evaluation               End of Session Equipment Utilized During Treatment: Gait belt Activity Tolerance: Patient tolerated treatment well Patient left: in chair;with call bell/phone within reach Nurse Communication: Mobility status;Precautions         Time: 0740-0809 PT Time Calculation (min) (ACUTE ONLY): 29 min   Charges:   PT Evaluation $Initial PT Evaluation Tier I: 1 Procedure PT Treatments $Gait Training: 8-22 mins   PT G CodesDelorse Key 07/11/2014, 10:04 AM Stephanie Key, PT [phone]

## 2014-07-12 ENCOUNTER — Encounter (HOSPITAL_COMMUNITY): Payer: Self-pay | Admitting: Cardiovascular Disease

## 2014-07-12 DIAGNOSIS — I10 Essential (primary) hypertension: Secondary | ICD-10-CM | POA: Diagnosis present

## 2014-07-12 DIAGNOSIS — E039 Hypothyroidism, unspecified: Secondary | ICD-10-CM | POA: Diagnosis present

## 2014-07-12 DIAGNOSIS — K219 Gastro-esophageal reflux disease without esophagitis: Secondary | ICD-10-CM | POA: Diagnosis present

## 2014-07-12 LAB — CBC
HEMATOCRIT: 35.2 % — AB (ref 36.0–46.0)
Hemoglobin: 11.4 g/dL — ABNORMAL LOW (ref 12.0–15.0)
MCH: 29.2 pg (ref 26.0–34.0)
MCHC: 32.4 g/dL (ref 30.0–36.0)
MCV: 90 fL (ref 78.0–100.0)
Platelets: 235 10*3/uL (ref 150–400)
RBC: 3.91 MIL/uL (ref 3.87–5.11)
RDW: 13.8 % (ref 11.5–15.5)
WBC: 8.5 10*3/uL (ref 4.0–10.5)

## 2014-07-12 LAB — GLUCOSE, CAPILLARY: Glucose-Capillary: 125 mg/dL — ABNORMAL HIGH (ref 65–99)

## 2014-07-12 MED ORDER — RANOLAZINE ER 500 MG PO TB12: 1000.0000 mg | ORAL_TABLET | Freq: Two times a day (BID) | ORAL | Status: DC

## 2014-07-12 MED ORDER — RANOLAZINE ER 500 MG PO TB12: 500.0000 mg | ORAL_TABLET | Freq: Two times a day (BID) | ORAL | Status: DC

## 2014-07-12 MED ORDER — CLOPIDOGREL BISULFATE 75 MG PO TABS: 75.0000 mg | ORAL_TABLET | Freq: Every day | ORAL | Status: AC

## 2014-07-12 MED ORDER — ATORVASTATIN CALCIUM 40 MG PO TABS: 40.0000 mg | ORAL_TABLET | Freq: Every day | ORAL | Status: DC

## 2014-07-12 MED ORDER — ASPIRIN 81 MG PO TBEC: 81.0000 mg | DELAYED_RELEASE_TABLET | Freq: Every day | ORAL | Status: DC

## 2014-07-12 MED ORDER — ISOSORBIDE MONONITRATE ER 30 MG PO TB24
30.0000 mg | ORAL_TABLET | Freq: Every day | ORAL | Status: DC
Start: 2014-07-12 — End: 2015-01-08

## 2014-07-12 MED FILL — Lidocaine HCl Local Preservative Free (PF) Inj 1%: INTRAMUSCULAR | Qty: 30 | Status: AC

## 2014-07-12 MED FILL — Heparin Sodium (Porcine) 2 Unit/ML in Sodium Chloride 0.9%: INTRAMUSCULAR | Qty: 1000 | Status: AC

## 2014-07-12 NOTE — Progress Notes (Signed)
Physical Therapy Treatment Patient Details Name: Stephanie Key MRN: [id number] DOB: 09-20-1928 Today's Date: 07/12/2014    History of Present Illness 82 y.o. Transferred from [facility] for management of NSTEMI and polymorphic VT. Hx of HTN    PT Comments    Pt progressing towards physical therapy goals. Was able to perform transfers and ambulation with 1 LOB during turn to sit on the commode. Min assist required to recover. Pt was on RA throughout session and sats remained >95% throughout gait training. Pt anticipates d/c home with 24 hour assist today.   Follow Up Recommendations  Home health PT;Supervision for mobility/OOB     Equipment Recommendations  Rolling walker with 5" wheels    Recommendations for Other Services       Precautions / Restrictions Precautions Precautions: Fall Restrictions Weight Bearing Restrictions: No    Mobility  Bed Mobility Overal bed mobility: Needs Assistance Bed Mobility: Supine to Sit     Supine to sit: Min guard     General bed mobility comments: No physical assist - guarding for safety.   Transfers Overall transfer level: Needs assistance Equipment used: Rolling walker (2 wheeled) Transfers: Sit to/from Stand Sit to Stand: Min guard;Min assist         General transfer comment: VC's for hand placement on seated surface for safety. 1 LOB when turning to sit on the commode, with min assist required to recover. Otherwise, min guard required.   Ambulation/Gait Ambulation/Gait assistance: Min guard Ambulation Distance (Feet): 150 Feet Assistive device: Rolling walker (2 wheeled) Gait Pattern/deviations: Step-through pattern;Decreased stride length;Trunk flexed Gait velocity: Decreased Gait velocity interpretation: Below normal speed for age/gender General Gait Details: cues for posture, position in RW, directional cues. Pt veering bil throughout with tendency to stay too posterior to RW   Stairs            Wheelchair  Mobility    Modified Rankin (Stroke Patients Only)       Balance Overall balance assessment: Needs assistance Sitting-balance support: Feet supported;No upper extremity supported Sitting balance-Leahy Scale: Good     Standing balance support: No upper extremity supported Standing balance-Leahy Scale: Fair Standing balance comment: Was able to stand to perform peri-care and wash hands without assist.                     Cognition Arousal/Alertness: Awake/alert Behavior During Therapy: WFL for tasks assessed/performed Overall Cognitive Status: Within Functional Limits for tasks assessed                      Exercises      General Comments        Pertinent Vitals/Pain Pain Assessment: No/denies pain    Home Living                      Prior Function            PT Goals (current goals can now be found in the care plan section) Acute Rehab PT Goals Patient Stated Goal: return home PT Goal Formulation: With patient Time For Goal Achievement: 07/25/14 Potential to Achieve Goals: Good Progress towards PT goals: Progressing toward goals    Frequency  Min 3X/week    PT Plan Current plan remains appropriate    Co-evaluation             End of Session Equipment Utilized During Treatment: Gait belt Activity Tolerance: Patient tolerated treatment well Patient left: in chair;with call  bell/phone within reach     Time: 0930-1004 PT Time Calculation (min) (ACUTE ONLY): 34 min  Charges:  $Gait Training: 8-22 mins $Therapeutic Activity: 8-22 mins                    G Codes:      Stephanie Key, Stephanie Key 07/12/2014, 12:14 PM  Stephanie Key, PT, DPT Acute Rehabilitation Services Pager: [phone]

## 2014-07-12 NOTE — Progress Notes (Signed)
Patient called c/o Chest Pain  2/10 mid chest, bp 158/81 HR 77 patient states that it could be from poor positioning in bed.Patient repositioned.NTG .4 mg SL given per order.Tylonol 650 mg given as well.Recheck bp after NTG 119/62 hr 62.Chest pain resolved.

## 2014-07-12 NOTE — Progress Notes (Signed)
Patient ID: Stephanie Key, female   DOB: 11/06/1928, [age] y.o.   MRN: [id number]     Subjective:  Contnues to complain of atypical intermittent chest pain.   Objective:  Filed Vitals:   07/12/14 0309 07/12/14 0452 07/12/14 0754 07/12/14 0800  BP:  124/62 152/81 119/62  Pulse:  76 77 76  Temp: 97.8 F (36.6 C) 97.6 F (36.4 C)    TempSrc: Oral Oral    Resp:  18    Height:      Weight:      SpO2:  94%      Intake/Output from previous day:  Intake/Output Summary (Last 24 hours) at 07/12/14 1043 Last data filed at 07/12/14 0800  Gross per 24 hour  Intake    240 ml  Output      0 ml  Net    240 ml    Affect appropriate Healthy:  appears stated age HEENT: normal Neck supple with no adenopathy JVP normal no bruits no thyromegaly Lungs clear with no wheezing and good diaphragmatic motion Heart:  S1/S2 no murmur, no rub, gallop or click PMI normal Abdomen: benighn, BS positve, no tenderness, no AAA no bruit.  No HSM or HJR Distal pulses intact with no bruits No edema Neuro non-focal Skin warm and dry No muscular weakness  CBC:  Recent Labs  07/11/14 0240 07/12/14 0401  WBC 8.8 8.5  HGB 11.2* 11.4*  HCT 34.5* 35.2*  MCV 90.1 90.0  PLT 229 235   Cardiac Enzymes:  Recent Labs  07/09/14 1441 07/09/14 1934 07/10/14 0048  TROPONINI 2.53* 2.19* 1.87*   Imaging: No results found.  Cardiac Studies:  ECG:  SR LAD no acute ST changes   Telemetry:  NSR no arrhythmia   Echo:  EF 55-60% mild MR   Medications:   . aspirin EC  81 mg Oral Daily  . atorvastatin  40 mg Oral q1800  . brimonidine  1 drop Both Eyes Q12H  . clopidogrel  75 mg Oral Q breakfast  . isosorbide mononitrate  30 mg Oral Daily  . latanoprost  1 drop Both Eyes QHS  . levofloxacin  500 mg Oral Daily  . levothyroxine  100 mcg Oral QAC breakfast  . magnesium oxide  400 mg Oral Daily  . metoprolol tartrate  12.5 mg Oral BID  . pantoprazole  40 mg Oral Daily  . ranolazine  500 mg Oral BID    . sodium chloride  3 mL Intravenous Q12H  . timolol  1 drop Both Eyes BID       Assessment/Plan:  NSTEMI:  Diffuse small vessel disease of diagonals and PLB  Medical Rx  Continue ASA/Plavix isordil and ranexa. Increase ranexa to 100 mg po BID.  VT: polymorphic is setting of ischemia non recurrent continue beta blocker and Rx for CAD  Do not think AICD indicated given age, normal EF and stability on antianginal meds  Plan:  We will discharge today. Follow up with Dossie Arbourim Gollan in [place] (he follows her husband). Arrange for an outpatient cardiac rehab referral - preferably in [place].    Lars MassonELSON, Stephanie Key 07/12/2014, 10:43 AM

## 2014-07-12 NOTE — Discharge Summary (Signed)
Discharge Summary   Patient ID: Stephanie Key,  MRN: [id number], DOB/AGE: 1928/07/24 81 y.o.  Admit date: 07/09/2014 Discharge date: 07/12/2014  Primary Care Provider: No PCP Per Patient Primary Cardiologist: Dr Johnsie Cancel  Discharge Diagnoses Principal Problem:   NSTEMI (non-ST elevated myocardial infarction) Active Problems:   Ventricular tachycardia   Hypertension   Hypothyroidism   GERD (gastroesophageal reflux disease)   Allergies Allergies  Allergen Reactions  . Ceclor [Cefaclor] Other (See Comments)    Breaking out in mouth  . Darvon [Propoxyphene] Other (See Comments)    Unknown   . Hydralazine Other (See Comments)    Abdominal Pain   . Pravastatin Other (See Comments)    Muscles hurt  . Losartan Rash    Increased BP   . Sulfa Antibiotics Rash    Procedures  Echocardiogram 07/09/2014 LV EF: 55% -  60%  ------------------------------------------------------------------- Indications:   Ventricular Tachycardia.  ------------------------------------------------------------------- Study Conclusions  - Left ventricle: The cavity size was normal. Systolic function was normal. The estimated ejection fraction was in the range of 55% to 60%. - Aortic valve: There was mild to moderate stenosis. Valve area (Vmax): 1.14 cm^2. - Mitral valve: There was mild regurgitation.    Cardiac catheterization 07/09/2014 Left cardiac cath: 07/09/2014 Dominance: Right   Left Anterior Descending  There is moderatethe vessel.   Jorene Minors LAD lesion, 60% stenosed. calcified, diffuse .   Marland Kitchen First Diagonal Branch   The vessel is small in size.   . 1st Diag lesion, 95% stenosed. diffuse . Tiny vessel   . Third Diagonal Branch   The vessel is small in size.   Colon Flattery 3rd Diag lesion, 90% stenosed. Medium-caliber vessel with severe ostial stenosis     Left Circumflex  There is mildthe vessel.     Right Coronary Artery   . Prox RCA lesion,  50% stenosed. diffuse, eccentric .   Marland Kitchen Mid RCA lesion, 30% stenosed. diffuse .   Marland Kitchen Second Right Posterolateral   . 2nd RPLB lesion, 70% stenosed.         FINAL CONCLUSIONS:  SEVERE DIFFUSE SMALL VESSEL CAD PRIMARILY INVOLVING THE DIAGONAL BRANCHES OF THE LAD, THE DISTAL LAD, AND THE POSTEROLATERAL BRANCH OF THE RCA  KNOWN NORMAL LV FUNCTION BY ECHO  RECOMMENDATIONS:  BECAUSE OF SMALL CALIBER VESSELS, I THINK BEST FOR MEDICAL THERAPY  WILL PUSH BETA-BLOCKER AND NITRATE RX AS TOLERATED  TRANSFER TO 2H STEPDOWN BED OVERNIGHT TO MONITOR RHYTHM AND FOLLOW ENZYME TREND  CONTINUE HEPARIN ANOTHER [facility] Course  Mr. Zuk is a 81 yo female with PMH of HTN transferred from [facility] for management of NSTEM and polymorphic VT. He has no prior h/o CAD. He started having substernal chest pain with palpitation and presyncope. Initial troponin was positive. While in the ED, he had 6 seconds of polymorphic VT. He was placed on IV amiodarone and heparin drip.  Echocardiogram obtained on 5/28 showed EF 55-60%, mild to moderate AS, mild MR. He underwent cardiac catheterization on 07/01/2014 which showed [place] severe diffuse small vessel CAD primarily involving diagonal branch of LAD, distal LAD and the posterolateral branch of RCA. Due to the small caliber of coronary vessels, medical therapy was recommended. Postprocedure, he was transferred to cardiac ICU for monitoring. IV heparin was continued for an additional 24 hours after cath. Imdur and ranolazine was started for ongoing chest pain. Due to resolution of polymorphic VT, amiodarone was discontinued.   Patient was seen in the morning  of 07/12/2014, at which time she was doing well. She is deemed stable for discharge from cardiology perspective. Since polymorphic VT happened in the setting of ischemia and has not recurred since, it was not felt AICD is not indicated given her age, normal EF and the stability while on antianginal  medication. Given elevated troponin, she is currently on DAPT. I have arranged 2-4 weeks outpatient follow-up in our [place] office. Emphasis has been placed on compliance with DAPT and monitoring of any anginal activity.   Discharge Vitals Blood pressure 119/62, pulse 76, temperature 97.6 F (36.4 C), temperature source Oral, resp. rate 18, height '5\' 1"'  (1.549 m), weight 162 lb 11.2 oz (73.8 kg), SpO2 94 %.  Filed Weights   07/09/14 1140 07/09/14 1830  Weight: 165 lb 11.2 oz (75.161 kg) 162 lb 11.2 oz (73.8 kg)    Labs  CBC  Recent Labs  07/11/14 0240 07/12/14 0401  WBC 8.8 8.5  HGB 11.2* 11.4*  HCT 34.5* 35.2*  MCV 90.1 90.0  PLT 229 235   Cardiac Enzymes  Recent Labs  07/09/14 1441 07/09/14 1934 07/10/14 0048  TROPONINI 2.53* 2.19* 1.87*    Disposition  Pt is being discharged home today in good condition.  Follow-up Plans & Appointments      Follow-up Information    Follow up with Kathlyn Sacramento, MD On 07/25/2014.   Specialty:  Cardiology   Why:  11:00am   Contact information:   [place] [address] [phone]       Discharge Medications    Medication List    STOP taking these medications        levofloxacin 500 MG tablet  Commonly known as:  LEVAQUIN     ramipril 5 MG capsule  Commonly known as:  ALTACE      TAKE these medications        alendronate 70 MG tablet  Commonly known as:  FOSAMAX  Take 70 mg by mouth once a week. Take with a full glass of water on an empty stomach.  Don't lie down for 30 minutes     aspirin 81 MG EC tablet  Take 1 tablet (81 mg total) by mouth daily.     atorvastatin 40 MG tablet  Commonly known as:  LIPITOR  Take 1 tablet (40 mg total) by mouth daily at 6 PM.     BLOOD GLUCOSE TEST STRIPS Strp  Use 2 (two) times daily. Use as instructed.     brimonidine-timolol 0.2-0.5 % ophthalmic solution  Commonly known as:  COMBIGAN  Place 1 drop into both eyes every 12 (twelve) hours.      CHIA SEED PO  Take 1 tablet by mouth daily as needed (for constipation).     CINNAMON PO  Take 1 tablet by mouth daily.     clopidogrel 75 MG tablet  Commonly known as:  PLAVIX  Take 1 tablet (75 mg total) by mouth daily with breakfast.     cyclobenzaprine 10 MG tablet  Commonly known as:  FLEXERIL  Take 10 mg by mouth at bedtime as needed. For muscle spasms     FIBER LAXATIVE PO  Take 1 tablet by mouth daily as needed (for constipation).     FIFTY50 GLUCOSE METER 2.0 W/DEVICE Kit  Use as directed.     fluticasone 50 MCG/ACT nasal spray  Commonly known as:  FLONASE  Place 2 sprays into the nose daily as needed for allergies (congestion).  isosorbide mononitrate 30 MG 24 hr tablet  Commonly known as:  IMDUR  Take 1 tablet (30 mg total) by mouth daily.     levothyroxine 100 MCG tablet  Commonly known as:  SYNTHROID, LEVOTHROID  Take 100 mcg by mouth daily. Take on an empty stomach with a glass of water at least 30-60 minutes before breakfast.     magnesium oxide 400 MG tablet  Commonly known as:  MAG-OX  Take 400 mg by mouth daily.     meclizine 25 MG tablet  Commonly known as:  ANTIVERT  Take 12.5-25 mg by mouth at bedtime as needed for dizziness.     metoprolol tartrate 25 MG tablet  Commonly known as:  LOPRESSOR  Take 12.5 mg by mouth 2 (two) times daily.     nabumetone 500 MG tablet  Commonly known as:  RELAFEN  Take 500 mg by mouth daily as needed for mild pain.     nitroGLYCERIN 0.4 MG SL tablet  Commonly known as:  NITROSTAT  Place 0.4 mg under the tongue every 5 (five) minutes as needed for chest pain.     omeprazole 20 MG capsule  Commonly known as:  PRILOSEC  Take 20 mg by mouth 2 (two) times daily.     ranolazine 500 MG 12 hr tablet  Commonly known as:  RANEXA  Take 2 tablets (1,000 mg total) by mouth 2 (two) times daily.     Travoprost (BAK Free) 0.004 % Soln ophthalmic solution  Commonly known as:  TRAVATAN  Place 1 drop into both eyes  at bedtime.     VITAMIN D-3 PO  Take 1 tablet by mouth daily.         Duration of Discharge Encounter   Greater than 30 minutes including physician time.  Hilbert Corrigan PA-C Pager: [phone] 07/12/2014, 10:57 AM

## 2014-07-12 NOTE — Care Management Note (Addendum)
Case Management Note  Patient Details  Name: Stephanie MouldDeloris R Key MRN: [id number] Date of Birth: 08/11/1928  Subjective/Objective:     Lives at home with husband who is on hospice and has [facility] assist also.  Daughter lives in [place] but is up here now and will be with them 24/7 until she leaves and then will have [facility] set up 24/7 if needed.    Appears plan is for discharge tomorrow.  PT recommending HH PT.  [facility] in past and patient would like to use also.  Referral made.  Will need face to face and HH orders placed prior to discharge.              Action/Plan:   Expected Discharge Date:                  Expected Discharge Plan:  Home w Home Health Services  In-House Referral:     Discharge planning Services     Post Acute Care Choice:    Choice offered to:     DME Arranged:    DME Agency:  [facility]  HH [facility]  [facility]     Status of Service:  In process, will continue to follow  Medicare Important Message Given:    Date Medicare IM Given:    Medicare IM give by:    Date Additional Medicare IM Given:    Additional Medicare Important Message give by:     If discussed at Long Length of Stay Meetings, dates discussed:    Additional Comments: 07/12/14 Raynald BlendSamantha Aminah Zabawa, RN, BSN [phone].  CM was contacted by MD post pt discharge, pt was sent home on Renexa and per pharmacy copay is approximately $276. NO HH ordered for Renexa assistance prior to discharge.  CM informed MD that pt would not qualify for copay assistance due to current medicare insurance.  CM asked MD if a less expensive medication could be substituted, per MD;  not at this time.  CM contacted pt daughter Jan at the request of the MD and provided Renexa website and phone number information for possible medication assistance.      07/12/14 Raynald BlendSamantha Cordai Rodrigue, RN, BSN [phone].  CM requested bedside nurse to contact [facility]  [facility] Epic prior to discharge.  HH already arranged with [facility].  Pt daughter will provide 24 hour supervision .     Cherylann ParrClaxton, Shantinique Picazo S, RN 07/12/2014, 11:04 AM

## 2014-07-12 NOTE — Progress Notes (Signed)
Noted order to DC patient home.Daughter in the room.Discharge instructions  given and patient and daughter verbalized understanding.

## 2014-07-13 ENCOUNTER — Encounter: Payer: Self-pay | Admitting: Physician Assistant

## 2014-07-14 ENCOUNTER — Other Ambulatory Visit: Payer: Self-pay

## 2014-07-14 MED ORDER — NITROGLYCERIN 0.4 MG SL SUBL: 0.4000 mg | SUBLINGUAL_TABLET | SUBLINGUAL | Status: AC | PRN

## 2014-07-14 NOTE — Telephone Encounter (Signed)
Pt requesting refill on nitroglycerin 0.4 mg tablet. [facility]17 with Dr. Mariah MillingGollan please advise if ok to refill?

## 2014-07-25 ENCOUNTER — Encounter: Payer: Medicare Other | Admitting: Cardiovascular Disease

## 2014-07-29 ENCOUNTER — Telehealth: Payer: Self-pay

## 2014-07-29 ENCOUNTER — Encounter: Payer: Self-pay | Admitting: Cardiovascular Disease

## 2014-07-29 ENCOUNTER — Ambulatory Visit (INDEPENDENT_AMBULATORY_CARE_PROVIDER_SITE_OTHER): Payer: Medicare Other | Admitting: Cardiovascular Disease

## 2014-07-29 ENCOUNTER — Other Ambulatory Visit: Payer: Self-pay

## 2014-07-29 VITALS — BP 136/72 | HR 65 | Ht 62.0 in | Wt 162.5 lb

## 2014-07-29 DIAGNOSIS — R0789 Other chest pain: Secondary | ICD-10-CM | POA: Diagnosis not present

## 2014-07-29 DIAGNOSIS — I214 Non-ST elevation (NSTEMI) myocardial infarction: Secondary | ICD-10-CM

## 2014-07-29 DIAGNOSIS — I472 Ventricular tachycardia, unspecified: Secondary | ICD-10-CM

## 2014-07-29 DIAGNOSIS — I10 Essential (primary) hypertension: Secondary | ICD-10-CM | POA: Diagnosis not present

## 2014-07-29 DIAGNOSIS — E785 Hyperlipidemia, unspecified: Secondary | ICD-10-CM | POA: Insufficient documentation

## 2014-07-29 MED ORDER — ATORVASTATIN CALCIUM 10 MG PO TABS: 10.0000 mg | ORAL_TABLET | Freq: Every day | ORAL | Status: DC

## 2014-07-29 MED ORDER — ROSUVASTATIN CALCIUM 5 MG PO TABS: 5.0000 mg | ORAL_TABLET | Freq: Every day | ORAL | Status: DC

## 2014-07-29 NOTE — Assessment & Plan Note (Signed)
Encouraged her to try Crestor 5 mg every other day for several weeks and titrate up to daily with cholesterol check in 3 months time

## 2014-07-29 NOTE — Progress Notes (Signed)
Patient ID: Stephanie Key, female    DOB: [birth year], 81 y.o.   MRN: [id number]  HPI Comments: Mrs. Stephanie Key is a pleasant 67 year old woman with recent non-ST elevation MI at the end of May 2016, cardiac catheterization showing severe disease of her D1 and D2, small vessel disease treated medically, history of hyperlipidemia, hypertension, hypothyroidism who presents to establish care in the [place] office. She had short run of nonsustained VT in the hospital  Since her discharge, she reports that she is feeling well. She denies any further chest pain symptoms. She is unable to afford Ranexa. She has been tolerating beta blocker and isosorbide. No regular exercise, legs are weak Otherwise reports that she is recovering well. She is not on Lipitor. Reports that her husband had significant leg problems with this medication and does not want to take it Notes indicate prior issues with pravastatin  EKG on today's visit shows normal sinus rhythm with rate 65 bpm, no significant ST or T-wave changes   Allergies  Allergen Reactions  . Ceclor [Cefaclor] Other (See Comments)    Breaking out in mouth  . Darvon [Propoxyphene] Other (See Comments)    Unknown   . Hydralazine Other (See Comments)    Abdominal Pain   . Pravastatin Other (See Comments)    Muscles hurt  . Losartan Rash    Increased BP   . Sulfa Antibiotics Rash    Current Outpatient Prescriptions on File Prior to Visit  Medication Sig Dispense Refill  . aspirin EC 81 MG EC tablet Take 1 tablet (81 mg total) by mouth daily.    . Blood Glucose Monitoring Suppl (FIFTY50 GLUCOSE METER 2.0) W/DEVICE KIT Use as directed.    . brimonidine-timolol (COMBIGAN) 0.2-0.5 % ophthalmic solution Place 1 drop into both eyes every 12 (twelve) hours.    . Calcium Polycarbophil (FIBER LAXATIVE PO) Take 1 tablet by mouth daily as needed (for constipation).    Marland Kitchen CHIA SEED PO Take 1 tablet by mouth daily as needed (for constipation).    .  Cholecalciferol (VITAMIN D-3 PO) Take 1 tablet by mouth daily.    Marland Kitchen CINNAMON PO Take 1 tablet by mouth daily.    . clopidogrel (PLAVIX) 75 MG tablet Take 1 tablet (75 mg total) by mouth daily with breakfast. 30 tablet 11  . cyclobenzaprine (FLEXERIL) 10 MG tablet Take 10 mg by mouth at bedtime as needed. For muscle spasms    . fluticasone (FLONASE) 50 MCG/ACT nasal spray Place 2 sprays into the nose daily as needed for allergies (congestion).     . Glucose Blood (BLOOD GLUCOSE TEST STRIPS) STRP Use 2 (two) times daily. Use as instructed.    . isosorbide mononitrate (IMDUR) 30 MG 24 hr tablet Take 1 tablet (30 mg total) by mouth daily. 30 tablet 5  . levothyroxine (SYNTHROID, LEVOTHROID) 100 MCG tablet Take 100 mcg by mouth daily. Take on an empty stomach with a glass of water at least 30-60 minutes before breakfast.    . magnesium oxide (MAG-OX) 400 MG tablet Take 400 mg by mouth daily.    . meclizine (ANTIVERT) 25 MG tablet Take 12.5-25 mg by mouth at bedtime as needed for dizziness.     . metoprolol tartrate (LOPRESSOR) 25 MG tablet Take 12.5 mg by mouth 2 (two) times daily.    . nabumetone (RELAFEN) 500 MG tablet Take 500 mg by mouth daily as needed for mild pain.    . nitroGLYCERIN (NITROSTAT) 0.4 MG SL tablet Place 1  tablet (0.4 mg total) under the tongue every 5 (five) minutes as needed for chest pain. 25 tablet 6  . omeprazole (PRILOSEC) 20 MG capsule Take 20 mg by mouth 2 (two) times daily.    . Travoprost, BAK Free, (TRAVATAN) 0.004 % SOLN ophthalmic solution Place 1 drop into both eyes at bedtime.     Current Facility-Administered Medications on File Prior to Visit  Medication Dose Route Frequency Provider Last Rate Last Dose  . 0.9 %  sodium chloride infusion  250 mL Intravenous PRN Dorothy Spark, MD      . 0.9 %  sodium chloride infusion   Intravenous Continuous Dorothy Spark, MD      . sodium chloride 0.9 % injection 3 mL  3 mL Intravenous Q12H Dorothy Spark, MD      .  sodium chloride 0.9 % injection 3 mL  3 mL Intravenous PRN Dorothy Spark, MD        Past Medical History  Diagnosis Date  . Hypertension   . Thyroid disease   . Hypothyroidism   . Anxiety   . Arthritis   . GERD (gastroesophageal reflux disease)   . NSTEMI (non-ST elevated myocardial infarction) 06/2014  . History of kidney stones   . Glaucoma   . CAD (coronary artery disease)     07/01/2014 severe diffuse small vessel CAD primarily involving diagonal branch of LAD, distal LAD and PLA branch of RCA, medical therapy, vessel too small for PCI    Past Surgical History  Procedure Laterality Date  . Abdominal hysterectomy    . Kidney surgery    . Tonsillectomy    . Cardiac catheterization  07/09/2014  . Cardiac catheterization N/A 07/09/2014    Procedure: Left Heart Cath and Coronary Angiography;  Surgeon: Sherren Mocha, MD;  Location: [facility];  Service: Cardiovascular;  Laterality: N/A;    Social History  reports that she has never smoked. She has never used smokeless tobacco. She reports that she does not drink alcohol or use illicit drugs.  Family History family history includes CAD in her father.       Review of Systems  Constitutional: Negative.   Eyes: Negative.   Respiratory: Negative.   Cardiovascular: Negative.   Gastrointestinal: Negative.   Musculoskeletal: Positive for gait problem.  Skin: Negative.   Neurological: Negative.   Hematological: Negative.   Psychiatric/Behavioral: Negative.   All other systems reviewed and are negative.   BP 136/72 mmHg  Pulse 65  Ht '5\' 2"'  (1.575 m)  Wt 162 lb 8 oz (73.71 kg)  BMI 29.71 kg/m2  LMP  (LMP Unknown)  Physical Exam  Constitutional: She is oriented to person, place, and time. She appears well-developed and well-nourished.  HENT:  Head: Normocephalic.  Nose: Nose normal.  Mouth/Throat: Oropharynx is clear and moist.  Eyes: Conjunctivae are normal. Pupils are equal, round, and reactive to light.   Neck: Normal range of motion. Neck supple. No JVD present.  Cardiovascular: Normal rate, regular rhythm, S1 normal, S2 normal, normal heart sounds and intact distal pulses.  Exam reveals no gallop and no friction rub.   No murmur heard. Pulmonary/Chest: Effort normal and breath sounds normal. No respiratory distress. She has no wheezes. She has no rales. She exhibits no tenderness.  Abdominal: Soft. Bowel sounds are normal. She exhibits no distension. There is no tenderness.  Musculoskeletal: Normal range of motion. She exhibits no edema or tenderness.  Lymphadenopathy:    She has no cervical  adenopathy.  Neurological: She is alert and oriented to person, place, and time. Coordination normal.  Skin: Skin is warm and dry. No rash noted. No erythema.  Psychiatric: She has a normal mood and affect. Her behavior is normal. Judgment and thought content normal.    Assessment and Plan  Nursing note and vitals reviewed.

## 2014-07-29 NOTE — Assessment & Plan Note (Signed)
Currently with no symptoms of angina. No further workup at this time. She is unable to afford ranexa. We'll start low-dose Crestor with her permission

## 2014-07-29 NOTE — Telephone Encounter (Signed)
Calling about Generic Crestor rx, 30 tabs are over $200 please call pharmacist.

## 2014-07-29 NOTE — Assessment & Plan Note (Signed)
Blood pressure is well controlled on today's visit. No changes made to the medications. 

## 2014-07-29 NOTE — Telephone Encounter (Signed)
Spoke w/ Thayer Ohm, pharmacist @ [facility].  He states that the cost for generic Crestor is still over $200 and that pt cannot afford this, so she did not p/u.

## 2014-07-29 NOTE — Assessment & Plan Note (Signed)
No symptoms concerning for arrhythmia. Encouraged her to stay on her metoprolol

## 2014-07-29 NOTE — Telephone Encounter (Signed)
Spoke w/ Thayer Ohm. Advised him of Dr. Windell Hummingbird recommendation.  He will fill updated rx for pt.

## 2014-07-29 NOTE — Telephone Encounter (Signed)
We may have to try low-dose Lipitor such as 10 mg daily until [facility] offers a more reasonable price I'm very surprised that the generic is not being offered at a generic price May need to try an alternate pharmacy. But for now could start very low-dose Lipitor with close monitoring for symptoms

## 2014-07-29 NOTE — Patient Instructions (Addendum)
You are doing well.  Please consider starting crestor every other day for a few weeks After a few weeks, Consider increasing up to one a day  Please call us if you have new issues that need to be addressed before your next appt.  Your physician wants you to follow-up in: 6 months.  You will receive a reminder letter in the mail two months in advance. If you don't receive a letter, please call our office to schedule the follow-up appointment.

## 2014-08-24 ENCOUNTER — Encounter: Payer: Medicare Other | Admitting: Cardiovascular Disease

## 2014-10-21 ENCOUNTER — Emergency Department
Admission: EM | Admit: 2014-10-21 | Discharge: 2014-10-21 | Discharge status: Against Medical Advice | Payer: Medicare Other | Attending: Student | Admitting: Student

## 2014-10-21 DIAGNOSIS — R2243 Localized swelling, mass and lump, lower limb, bilateral: Secondary | ICD-10-CM | POA: Insufficient documentation

## 2014-10-21 DIAGNOSIS — I10 Essential (primary) hypertension: Secondary | ICD-10-CM | POA: Insufficient documentation

## 2014-10-21 LAB — CBC WITH DIFFERENTIAL/PLATELET
BASOS PCT: 0 %
Basophils Absolute: 0 10*3/uL (ref 0–0.1)
Eosinophils Absolute: 0.3 10*3/uL (ref 0–0.7)
Eosinophils Relative: 4 %
HEMATOCRIT: 38 % (ref 35.0–47.0)
HEMOGLOBIN: 12.5 g/dL (ref 12.0–16.0)
Lymphocytes Relative: 29 %
Lymphs Abs: 2.1 10*3/uL (ref 1.0–3.6)
MCH: 29.4 pg (ref 26.0–34.0)
MCHC: 32.8 g/dL (ref 32.0–36.0)
MCV: 89.6 fL (ref 80.0–100.0)
Monocytes Absolute: 0.9 10*3/uL (ref 0.2–0.9)
Monocytes Relative: 12 %
NEUTROS ABS: 4.2 10*3/uL (ref 1.4–6.5)
NEUTROS PCT: 55 %
Platelets: 277 10*3/uL (ref 150–440)
RBC: 4.24 MIL/uL (ref 3.80–5.20)
RDW: 14.4 % (ref 11.5–14.5)
WBC: 7.5 10*3/uL (ref 3.6–11.0)

## 2014-10-21 LAB — BRAIN NATRIURETIC PEPTIDE: B Natriuretic Peptide: 72 pg/mL (ref 0.0–100.0)

## 2014-10-21 LAB — COMPREHENSIVE METABOLIC PANEL
ALT: 38 U/L (ref 14–54)
ANION GAP: 6 (ref 5–15)
AST: 49 U/L — ABNORMAL HIGH (ref 15–41)
Albumin: 3.9 g/dL (ref 3.5–5.0)
Alkaline Phosphatase: 78 U/L (ref 38–126)
BILIRUBIN TOTAL: 0.5 mg/dL (ref 0.3–1.2)
BUN: 16 mg/dL (ref 6–20)
CO2: 27 mmol/L (ref 22–32)
Calcium: 10.6 mg/dL — ABNORMAL HIGH (ref 8.9–10.3)
Chloride: 106 mmol/L (ref 101–111)
Creatinine, Ser: 0.9 mg/dL (ref 0.44–1.00)
GFR calc non Af Amer: 57 mL/min — ABNORMAL LOW (ref >60)
Glucose, Bld: 108 mg/dL — ABNORMAL HIGH (ref 65–99)
Potassium: 4.3 mmol/L (ref 3.5–5.1)
SODIUM: 139 mmol/L (ref 135–145)
TOTAL PROTEIN: 7.4 g/dL (ref 6.5–8.1)

## 2014-10-21 NOTE — ED Notes (Signed)
Pt to ED c/o bilateral foot and lower extremity swelling. Pt denies and ShOB and any travel in recent weeks.  Pt presents with mild edema to L lower leg.

## 2015-01-08 ENCOUNTER — Other Ambulatory Visit: Payer: Self-pay | Admitting: Cardiovascular Disease

## 2015-01-13 ENCOUNTER — Other Ambulatory Visit: Payer: Self-pay | Admitting: Physician Assistant

## 2015-02-24 ENCOUNTER — Other Ambulatory Visit: Payer: Self-pay | Admitting: Cardiovascular Disease

## 2015-03-17 ENCOUNTER — Ambulatory Visit (INDEPENDENT_AMBULATORY_CARE_PROVIDER_SITE_OTHER): Payer: Medicare Other | Admitting: Cardiovascular Disease

## 2015-03-17 ENCOUNTER — Encounter: Payer: Self-pay | Admitting: Cardiovascular Disease

## 2015-03-17 VITALS — BP 138/80 | HR 68 | Ht 62.0 in | Wt 172.5 lb

## 2015-03-17 DIAGNOSIS — E785 Hyperlipidemia, unspecified: Secondary | ICD-10-CM | POA: Diagnosis not present

## 2015-03-17 DIAGNOSIS — I10 Essential (primary) hypertension: Secondary | ICD-10-CM

## 2015-03-17 DIAGNOSIS — I472 Ventricular tachycardia, unspecified: Secondary | ICD-10-CM

## 2015-03-17 DIAGNOSIS — I214 Non-ST elevation (NSTEMI) myocardial infarction: Secondary | ICD-10-CM

## 2015-03-17 MED ORDER — ROSUVASTATIN CALCIUM 5 MG PO TABS: 5.0000 mg | ORAL_TABLET | Freq: Every day | ORAL | Status: DC

## 2015-03-17 NOTE — Patient Instructions (Signed)
You are doing well.  Please start crestor 5 mg daily  Please call us if you have new issues that need to be addressed before your next appt.  Your physician wants you to follow-up in: 6 months.  You will receive a reminder letter in the mail two months in advance. If you don't receive a letter, please call our office to schedule the follow-up appointment.

## 2015-03-17 NOTE — Assessment & Plan Note (Signed)
Denies any symptoms concerning for arrhythmia, no palpitations , no significant shortness of breath

## 2015-03-17 NOTE — Assessment & Plan Note (Signed)
Blood pressure is well controlled on today's visit. No changes made to the medications. 

## 2015-03-17 NOTE — Assessment & Plan Note (Signed)
Symptoms of chronic angina She does not want NTG or other changes to medication Declined  Stress testing at this time

## 2015-03-17 NOTE — Assessment & Plan Note (Signed)
Long discussion with her concerning her cholesterol medication  Recommended she start Crestor 5 mg daily , she has this  At home, new refill has been sent in  Goal LDL less than 70 given diffuse small vessel disease

## 2015-03-17 NOTE — Progress Notes (Signed)
Patient ID: Stephanie Key, female    DOB: December 17, 1928, 82 y.o.   MRN: [id number]  HPI Comments: Stephanie Key is a pleasant 82 year old woman with  non-ST elevation MI at the end of May 2016, cardiac catheterization showing severe disease of her D1 and D2, small vessel disease treated medically, hyperlipidemia, hypertension, hypothyroidism who presents for routine follow-up of her coronary artery disease. She had short run of nonsustained VT in the hospital  In follow-up today, she reports that she has rare episodes of pain around her lower mediastinum, xiphoid area Does not happen very often, typically happens if she "overdoes it" Does not take nitroglycerin for her symptoms as it does not last very long Symptoms are not getting worse, about the same as before  Reports that her daughter told her not to take her cholesterol medication, as she was worried about things that she read She does have Crestor at home but has not been taking it Husband died in 04/18/2014, currently lives alone, hard of hearing Having trouble with her weight, balance, chronic vertigo per the patient Does not exercise secondary to chronic vertigo  EKG on today's visit shows normal sinus rhythm with rate 68 bpm, no significant ST or T-wave changes  Other past medical history reviewed She is unable to afford Ranexa. She has been tolerating beta blocker and isosorbide She is not on Lipitor. Reports that her husband had significant leg problems with this medication and does not want to take it Notes indicate prior issues with pravastatin   Allergies  Allergen Reactions  . Ceclor [Cefaclor] Other (See Comments)    Breaking out in mouth  . Darvon [Propoxyphene] Other (See Comments)    Unknown   . Hydralazine Other (See Comments)    Abdominal Pain   . Pravastatin Other (See Comments)    Muscles hurt  . Losartan Rash    Increased BP   . Sulfa Antibiotics Rash    Current Outpatient Prescriptions on File Prior to Visit   Medication Sig Dispense Refill  . aspirin EC 81 MG EC tablet Take 1 tablet (81 mg total) by mouth daily.    . Blood Glucose Monitoring Suppl (FIFTY50 GLUCOSE METER 2.0) W/DEVICE KIT Use as directed.    . brimonidine-timolol (COMBIGAN) 0.2-0.5 % ophthalmic solution Place 1 drop into both eyes every 12 (twelve) hours.    . Calcium Polycarbophil (FIBER LAXATIVE PO) Take 1 tablet by mouth daily as needed (for constipation).    Marland Kitchen CHIA SEED PO Take 1 tablet by mouth daily as needed (for constipation).    . Cholecalciferol (VITAMIN D-3 PO) Take 1 tablet by mouth daily.    Marland Kitchen CINNAMON PO Take 1 tablet by mouth daily.    . clopidogrel (PLAVIX) 75 MG tablet Take 1 tablet (75 mg total) by mouth daily with breakfast. 30 tablet 11  . cyclobenzaprine (FLEXERIL) 10 MG tablet Take 10 mg by mouth at bedtime as needed. For muscle spasms    . fluticasone (FLONASE) 50 MCG/ACT nasal spray Place 2 sprays into the nose daily as needed for allergies (congestion).     . Glucose Blood (BLOOD GLUCOSE TEST STRIPS) STRP Use 2 (two) times daily. Use as instructed.    . isosorbide mononitrate (IMDUR) 30 MG 24 hr tablet TAKE ONE TABLET BY MOUTH EVERY DAY 30 tablet 3  . levothyroxine (SYNTHROID, LEVOTHROID) 100 MCG tablet Take 100 mcg by mouth daily. Take on an empty stomach with a glass of water at least 30-60 minutes before breakfast.    .  magnesium oxide (MAG-OX) 400 MG tablet Take 400 mg by mouth daily.    . meclizine (ANTIVERT) 25 MG tablet Take 12.5-25 mg by mouth at bedtime as needed for dizziness.     . metoprolol tartrate (LOPRESSOR) 25 MG tablet TAKE 1/2 TABLET BY MOUTH TWICE DAILY 30 tablet 1  . nabumetone (RELAFEN) 500 MG tablet Take 500 mg by mouth daily as needed for mild pain.    . nitroGLYCERIN (NITROSTAT) 0.4 MG SL tablet Place 1 tablet (0.4 mg total) under the tongue every 5 (five) minutes as needed for chest pain. 25 tablet 6  . omeprazole (PRILOSEC) 20 MG capsule Take 20 mg by mouth daily.     . Travoprost,  BAK Free, (TRAVATAN) 0.004 % SOLN ophthalmic solution Place 1 drop into both eyes at bedtime.     Current Facility-Administered Medications on File Prior to Visit  Medication Dose Route Frequency Provider Last Rate Last Dose  . 0.9 %  sodium chloride infusion  250 mL Intravenous PRN Dorothy Spark, MD      . 0.9 %  sodium chloride infusion   Intravenous Continuous Dorothy Spark, MD      . sodium chloride 0.9 % injection 3 mL  3 mL Intravenous Q12H Dorothy Spark, MD      . sodium chloride 0.9 % injection 3 mL  3 mL Intravenous PRN Dorothy Spark, MD        Past Medical History  Diagnosis Date  . Hypertension   . Thyroid disease   . Hypothyroidism   . Anxiety   . Arthritis   . GERD (gastroesophageal reflux disease)   . NSTEMI (non-ST elevated myocardial infarction) ([place]) 06/2014  . History of kidney stones   . Glaucoma   . CAD (coronary artery disease)     07/01/2014 severe diffuse small vessel CAD primarily involving diagonal branch of LAD, distal LAD and PLA branch of RCA, medical therapy, vessel too small for PCI    Past Surgical History  Procedure Laterality Date  . Abdominal hysterectomy    . Kidney surgery    . Tonsillectomy    . Cardiac catheterization  07/09/2014  . Cardiac catheterization N/A 07/09/2014    Procedure: Left Heart Cath and Coronary Angiography;  Surgeon: Sherren Mocha, MD;  Location: [facility];  Service: Cardiovascular;  Laterality: N/A;    Social History  reports that she has never smoked. She has never used smokeless tobacco. She reports that she does not drink alcohol or use illicit drugs.  Family History family history includes CAD in her father.       Review of Systems  Constitutional: Negative.   Eyes: Negative.   Respiratory: Negative.   Cardiovascular: Negative.   Gastrointestinal: Negative.   Musculoskeletal: Positive for gait problem.  Skin: Negative.   Neurological: Negative.   Hematological: Negative.    Psychiatric/Behavioral: Negative.   All other systems reviewed and are negative.   BP 138/80 mmHg  Pulse 68  Ht '5\' 2"'  (1.575 m)  Wt 172 lb 8 oz (78.245 kg)  BMI 31.54 kg/m2  LMP  (LMP Unknown)  Physical Exam  Constitutional: She is oriented to person, place, and time. She appears well-developed and well-nourished.  HENT:  Head: Normocephalic.  Nose: Nose normal.  Mouth/Throat: Oropharynx is clear and moist.  Eyes: Conjunctivae are normal. Pupils are equal, round, and reactive to light.  Neck: Normal range of motion. Neck supple. No JVD present.  Cardiovascular: Normal rate, regular rhythm, S1  normal, S2 normal and intact distal pulses.  Exam reveals no gallop and no friction rub.   Murmur heard.  Systolic murmur is present with a grade of 1/6  Pulmonary/Chest: Effort normal and breath sounds normal. No respiratory distress. She has no wheezes. She has no rales. She exhibits no tenderness.  Abdominal: Soft. Bowel sounds are normal. She exhibits no distension. There is no tenderness.  Musculoskeletal: Normal range of motion. She exhibits no edema or tenderness.  Lymphadenopathy:    She has no cervical adenopathy.  Neurological: She is alert and oriented to person, place, and time. Coordination normal.  Skin: Skin is warm and dry. No rash noted. No erythema.  Psychiatric: She has a normal mood and affect. Her behavior is normal. Judgment and thought content normal.    Assessment and Plan  Nursing note and vitals reviewed.

## 2015-04-10 ENCOUNTER — Other Ambulatory Visit: Payer: Self-pay | Admitting: Cardiovascular Disease

## 2015-07-11 ENCOUNTER — Other Ambulatory Visit: Payer: Self-pay | Admitting: *Deleted

## 2015-07-11 MED ORDER — ISOSORBIDE MONONITRATE ER 30 MG PO TB24: 30.0000 mg | ORAL_TABLET | Freq: Every day | ORAL | Status: AC

## 2015-07-11 NOTE — Telephone Encounter (Signed)
Requested Prescriptions   Signed Prescriptions Disp Refills  . isosorbide mononitrate (IMDUR) 30 MG 24 hr tablet 90 tablet 3    Sig: Take 1 tablet (30 mg total) by mouth daily.    Authorizing Provider: GOLLAN, TIMOTHY J    Ordering User: LOPEZ, MARINA C    

## 2015-07-13 ENCOUNTER — Other Ambulatory Visit: Payer: Self-pay | Admitting: Cardiovascular Disease

## 2015-08-16 ENCOUNTER — Telehealth: Payer: Self-pay | Admitting: Cardiovascular Disease

## 2015-08-16 NOTE — Telephone Encounter (Signed)
Spoke w/ pt.  She reports that she saw Dr. Judithann SheenSparks this am She reports that [place] is doing fine[place] not sure when she will be leaving.  She denies chest pain at the moment, states that pain is very minor and does not occur very often.  She requests and appt to see Dr. Mariah MillingGollan before she moves. Sched her for 10/12/15 @ 2:00 and placed her on wait list in the event of a cancellation.  Asked her to call back if sx recur.

## 2015-08-16 NOTE — Telephone Encounter (Signed)
[facility] Judith[facility] for patient was complaining of CP  [place], just was worried has intermittent CP here and there  Denies CP right now.  Pt c/o of Chest Pain: STAT if CP now or developed within 24 hours  1. Are you having CP right now? No   2. Are you experiencing any other symptoms (ex. SOB, nausea, vomiting, sweating)? No nothing just a little pain in her chest.   3. How long have you been experiencing CP? Pt not sure...  4. Is your CP continuous or coming and going? Coming and going   5. Have you taken Nitroglycerin? Take it when she has the small pains  ?

## 2015-09-04 ENCOUNTER — Ambulatory Visit (INDEPENDENT_AMBULATORY_CARE_PROVIDER_SITE_OTHER): Payer: Medicare Other | Admitting: Cardiovascular Disease

## 2015-09-04 ENCOUNTER — Encounter: Payer: Self-pay | Admitting: Cardiovascular Disease

## 2015-09-04 ENCOUNTER — Telehealth: Payer: Self-pay | Admitting: Cardiovascular Disease

## 2015-09-04 VITALS — BP 140/70 | HR 76 | Ht 62.0 in | Wt 170.5 lb

## 2015-09-04 DIAGNOSIS — E785 Hyperlipidemia, unspecified: Secondary | ICD-10-CM | POA: Diagnosis not present

## 2015-09-04 DIAGNOSIS — I214 Non-ST elevation (NSTEMI) myocardial infarction: Secondary | ICD-10-CM

## 2015-09-04 DIAGNOSIS — R079 Chest pain, unspecified: Secondary | ICD-10-CM

## 2015-09-04 DIAGNOSIS — K219 Gastro-esophageal reflux disease without esophagitis: Secondary | ICD-10-CM

## 2015-09-04 DIAGNOSIS — I10 Essential (primary) hypertension: Secondary | ICD-10-CM

## 2015-09-04 NOTE — Telephone Encounter (Signed)
I have discussed this w/ pt previously.  She needs to be seen.  She has an appt on 7/26, but will call her if anyone cancels before that time.

## 2015-09-04 NOTE — Progress Notes (Signed)
Cardiology Office Note  Date:  09/04/2015   ID:  Stephanie Key, DOB October 22, 1928, MRN [id number]  PCP:  Idelle Crouch, MD   Chief Complaint  Patient presents with  . Other    C/o chest pain. Meds reviewed verbally with pt.    HPI:  Stephanie Key is a pleasant 82 year old woman with  non-ST elevation MI at the end of May 2016, cardiac catheterization showing severe disease of her D1 and D2, small vessel disease treated medically, hyperlipidemia, hypertension, hypothyroidism who presents for routine follow-up of her coronary artery disease. She had short run of nonsustained VT in the hospital History of chronic vertigo  She reports that she was married 33 years, husband died 1 year ago Currently lives alone, planning on moving down to [place] to be near her daughter Currently lives in a condo  Reports having chronic chest pain, thinks it is from heartburn Take tums, omeprazole prn, tic tacs Does not take nitroglycerin for her chest pain, does not last long enough Symptoms do not seem to be associated with exertion On her last clinic visit she was having similar symptoms,  typically happens if she "overdoes it" Symptoms are not getting worse, about the same as before   In the past, daughter told her not to take her cholesterol medication, as she was worried about things that she read She does have Crestor at home but has not been taking it  EKG on today's visit shows normal sinus rhythm with rate 76 bpm, no significant ST or T-wave changes  Other past medical history reviewed She is unable to afford Ranexa. She has been tolerating beta blocker and isosorbide She is not on Lipitor. Reports that her husband had significant leg problems with this medication and does not want to take it Notes indicate prior issues with pravastatin  PMH:   has a past medical history of Anxiety; Arthritis; CAD (coronary artery disease); GERD (gastroesophageal reflux disease); Glaucoma; History of  kidney stones; Hypertension; Hypothyroidism; NSTEMI (non-ST elevated myocardial infarction) ([place]) (06/2014); and Thyroid disease.  PSH:    Past Surgical History:  Procedure Laterality Date  . ABDOMINAL HYSTERECTOMY    . CARDIAC CATHETERIZATION  07/09/2014  . CARDIAC CATHETERIZATION N/A 07/09/2014   Procedure: Left Heart Cath and Coronary Angiography;  Surgeon: Sherren Mocha, MD;  Location: [facility];  Service: Cardiovascular;  Laterality: N/A;  . KIDNEY SURGERY    . TONSILLECTOMY      Current Outpatient Prescriptions  Medication Sig Dispense Refill  . aspirin 325 MG tablet Take 325 mg by mouth daily.    . Blood Glucose Monitoring Suppl (FIFTY50 GLUCOSE METER 2.0) W/DEVICE KIT Use as directed.    . brimonidine-timolol (COMBIGAN) 0.2-0.5 % ophthalmic solution Place 1 drop into both eyes every 12 (twelve) hours.    . Calcium Polycarbophil (FIBER LAXATIVE PO) Take 1 tablet by mouth daily as needed (for constipation).    Marland Kitchen CHIA SEED PO Take 1 tablet by mouth daily as needed (for constipation).    . Cholecalciferol (VITAMIN D-3 PO) Take 1 tablet by mouth daily.    Marland Kitchen CINNAMON PO Take 1 tablet by mouth daily.    . clopidogrel (PLAVIX) 75 MG tablet Take 1 tablet (75 mg total) by mouth daily with breakfast. 30 tablet 11  . cyclobenzaprine (FLEXERIL) 10 MG tablet Take 10 mg by mouth at bedtime as needed. For muscle spasms    . fluticasone (FLONASE) 50 MCG/ACT nasal spray Place 2 sprays into the nose daily as  needed for allergies (congestion).     . Glucose Blood (BLOOD GLUCOSE TEST STRIPS) STRP Use 2 (two) times daily. Use as instructed.    . isosorbide mononitrate (IMDUR) 30 MG 24 hr tablet Take 1 tablet (30 mg total) by mouth daily. 90 tablet 3  . levothyroxine (SYNTHROID, LEVOTHROID) 100 MCG tablet Take 100 mcg by mouth daily. Take on an empty stomach with a glass of water at least 30-60 minutes before breakfast.    . magnesium oxide (MAG-OX) 400 MG tablet Take 400 mg by mouth daily.    .  meclizine (ANTIVERT) 25 MG tablet Take 12.5-25 mg by mouth at bedtime as needed for dizziness.     . metoprolol tartrate (LOPRESSOR) 25 MG tablet TAKE 1/2 TABLET BY MOUTH TWICE DAILY (Patient taking differently: Takes 1/2 tablet TID.) 30 tablet 3  . nabumetone (RELAFEN) 500 MG tablet Take 500 mg by mouth daily as needed for mild pain.    . nitroGLYCERIN (NITROSTAT) 0.4 MG SL tablet Place 1 tablet (0.4 mg total) under the tongue every 5 (five) minutes as needed for chest pain. 25 tablet 6  . omeprazole (PRILOSEC) 20 MG capsule Take 20 mg by mouth daily.     . Travoprost, BAK Free, (TRAVATAN) 0.004 % SOLN ophthalmic solution Place 1 drop into both eyes at bedtime.     No current facility-administered medications for this visit.    Facility-Administered Medications Ordered in Other Visits  Medication Dose Route Frequency Provider Last Rate Last Dose  . 0.9 %  sodium chloride infusion  250 mL Intravenous PRN Dorothy Spark, MD      . 0.9 %  sodium chloride infusion   Intravenous Continuous Dorothy Spark, MD      . sodium chloride 0.9 % injection 3 mL  3 mL Intravenous Q12H Dorothy Spark, MD      . sodium chloride 0.9 % injection 3 mL  3 mL Intravenous PRN Dorothy Spark, MD         Allergies:   Ceclor [cefaclor]; Darvon [propoxyphene]; Hydralazine; Pravastatin; Losartan; and Sulfa antibiotics   Social History:  The patient  reports that she has never smoked. She has never used smokeless tobacco. She reports that she does not drink alcohol or use drugs.   Family History:   family history includes CAD in her father.    Review of Systems: Review of Systems  Constitutional: Negative.   Respiratory: Negative.   Cardiovascular: Positive for chest pain.  Gastrointestinal: Positive for heartburn.  Musculoskeletal: Negative.   Neurological: Negative.   Psychiatric/Behavioral: Negative.   All other systems reviewed and are negative.    PHYSICAL EXAM: VS:  BP 140/70 (BP  Location: Left Arm, Patient Position: Sitting, Cuff Size: Normal)   Pulse 76   Ht '5\' 2"'  (1.575 m)   Wt 170 lb 8 oz (77.3 kg)   LMP  (LMP Unknown)   BMI 31.18 kg/m  , BMI Body mass index is 31.18 kg/m. GEN: Well nourished, well developed, in no acute distress  HEENT: normal  Neck: no JVD, carotid bruits, or masses Cardiac: RRR; no murmurs, rubs, or gallops,no edema  Respiratory:  clear to auscultation bilaterally, normal work of breathing GI: soft, nontender, nondistended, + BS MS: no deformity or atrophy  Skin: warm and dry, no rash Neuro:  Strength and sensation are intact Psych: euthymic mood, full affect    Recent Labs: 10/21/2014: ALT 38; B Natriuretic Peptide 72.0; BUN 16; Creatinine, Ser 0.90; Hemoglobin 12.5; Platelets  277; Potassium 4.3; Sodium 139    Lipid Panel Lab Results  Component Value Date   CHOL 191 09/11/2013   HDL 40 09/11/2013   LDLCALC 125 (H) 09/11/2013   TRIG 129 09/11/2013      Wt Readings from Last 3 Encounters:  09/04/15 170 lb 8 oz (77.3 kg)  03/17/15 172 lb 8 oz (78.2 kg)  10/21/14 165 lb (74.8 kg)       ASSESSMENT AND PLAN:  Chest pain, unspecified chest pain type - Plan: EKG 12-Lead Atypical chest pain, not associated with exertion No significant change since her last clinic visit Recommended she take her omeprazole on a daily basis not as needed She has follow-up appointment with GI per the patient  NSTEMI (non-ST elevated myocardial infarction) ([place]) She does not want a statin, recommended she stay on her aspirin, Plavix Isosorbide, metoprolol  Essential hypertension Blood pressure is well controlled on today's visit. No changes made to the medications.  Hyperlipidemia She does not want a statin  Gastroesophageal reflux disease, esophagitis presence not specified Symptoms concerning for GERD. Recommended Tums, Pepcid, omeprazole   Significant difficulty communicating with the patient, She seemed confused at times, poor  historian, unable to provide a good history  Total encounter time more than 25 minutes  Greater than 50% was spent in counseling and coordination of care with the patient   Disposition:   F/U  6 months   Orders Placed This Encounter  Procedures  . EKG 12-Lead     Signed, Esmond Plants, M.D., Ph.D. 09/04/2015  [place], [place]

## 2015-09-04 NOTE — Telephone Encounter (Signed)
Dr. Mariah Milling had a cancellation today @ 3:40.   Pt lives a few miles away and can be here in a few minutes.

## 2015-09-04 NOTE — Telephone Encounter (Signed)
Pt c/o of Chest Pain: STAT if CP now or developed within 24 hours  1. Are you having CP right now? Some angina pain, a little bit, states it has subsided  2. Are you experiencing any other symptoms (ex. SOB, nausea, vomiting, sweating)? no  3. How long have you been experiencing CP? The last few days, it comes and then will go away  4. Is your CP continuous or coming and going? Comes and goes  5. Have you taken Nitroglycerin? No  Pt has an appointment on 7/26. ?

## 2015-09-04 NOTE — Patient Instructions (Addendum)
Medication Instructions:   No changes  Labwork:  New new labs  Testing/Procedures:  No new testing  Follow-Up: It was a pleasure seeing you in the office today. Please call us if you have new issues that need to be addressed before your next appt.  [phone]  Your physician wants you to follow-up in: 12 months.  You will receive a reminder letter in the mail two months in advance. If you don't receive a letter, please call our office to schedule the follow-up appointment.  If you need a refill on your cardiac medications before your next appointment, please call your pharmacy.

## 2015-09-04 NOTE — Telephone Encounter (Signed)
Pt arrived in the office @ 4:22 pm.

## 2015-09-04 NOTE — Telephone Encounter (Signed)
Pt has not shown for her appt.  Attempted to contact her, no answer at home or mobile #s.

## 2015-09-06 ENCOUNTER — Other Ambulatory Visit: Payer: Self-pay | Admitting: Nurse Practitioner

## 2015-09-06 ENCOUNTER — Ambulatory Visit: Payer: Medicare Other | Admitting: Cardiovascular Disease

## 2015-09-06 DIAGNOSIS — R1013 Epigastric pain: Secondary | ICD-10-CM

## 2015-09-08 ENCOUNTER — Ambulatory Visit: Payer: Medicare Other

## 2015-09-08 ENCOUNTER — Ambulatory Visit
Admission: RE | Admit: 2015-09-08 | Discharge: 2015-09-08 | Disposition: A | Discharge status: Home / Self Care | Payer: Medicare Other | Source: Ambulatory Visit | Attending: Nurse Practitioner | Admitting: Nurse Practitioner

## 2015-09-08 DIAGNOSIS — R1310 Dysphagia, unspecified: Secondary | ICD-10-CM | POA: Insufficient documentation

## 2015-09-08 DIAGNOSIS — K449 Diaphragmatic hernia without obstruction or gangrene: Secondary | ICD-10-CM | POA: Insufficient documentation

## 2015-09-08 DIAGNOSIS — R1013 Epigastric pain: Secondary | ICD-10-CM

## 2015-10-12 ENCOUNTER — Ambulatory Visit: Payer: Medicare Other | Admitting: Cardiovascular Disease

## 2018-06-05 ENCOUNTER — Telehealth: Payer: Self-pay

## 2018-06-05 NOTE — Telephone Encounter (Signed)
Called patient from recall list.  No answer. Vm was full.  Patient is past due for follow up appointment.

## 2018-06-12 NOTE — Telephone Encounter (Signed)
Called patient from recall.  No answer. NO VM.  This is the 3rd attempt per recall list.  Will delete recall.

## 2022-04-12 DEATH — deceased
